# Patient Record
Sex: Male | Born: 1960
Health system: Southern US, Community
[De-identification: ages and names within clinical notes are randomized; demographics above are authoritative.]

## PROBLEM LIST (undated history)

## (undated) DIAGNOSIS — R1030 Lower abdominal pain, unspecified: Secondary | ICD-10-CM

## (undated) DIAGNOSIS — N509 Disorder of male genital organs, unspecified: Secondary | ICD-10-CM

## (undated) DIAGNOSIS — S335XXA Sprain of ligaments of lumbar spine, initial encounter: Secondary | ICD-10-CM

## (undated) DIAGNOSIS — M545 Low back pain, unspecified: Secondary | ICD-10-CM

## (undated) DIAGNOSIS — K4090 Unilateral inguinal hernia, without obstruction or gangrene, not specified as recurrent: Secondary | ICD-10-CM

## (undated) HISTORY — DX: Disorder of male genital organs, unspecified: N50.9

## (undated) HISTORY — PX: HERNIA REPAIR: SHX51

## (undated) HISTORY — DX: Lower abdominal pain, unspecified: R10.30

## (undated) HISTORY — DX: Unilateral inguinal hernia, without obstruction or gangrene, not specified as recurrent: K40.90

## (undated) HISTORY — DX: Low back pain: M54.5

## (undated) HISTORY — DX: Sprain of ligaments of lumbar spine, initial encounter: S33.5XXA

## (undated) HISTORY — DX: Low back pain, unspecified: M54.50

---

## 2004-06-13 ENCOUNTER — Ambulatory Visit: Payer: Self-pay | Admitting: Family Medicine

## 2004-07-07 ENCOUNTER — Ambulatory Visit: Payer: Self-pay | Admitting: Family Medicine

## 2004-07-20 ENCOUNTER — Ambulatory Visit: Payer: Self-pay | Admitting: Family Medicine

## 2004-07-20 ENCOUNTER — Ambulatory Visit (HOSPITAL_COMMUNITY): Admission: RE | Admit: 2004-07-20 | Discharge: 2004-07-20 | Payer: Self-pay | Admitting: Family Medicine

## 2004-09-07 ENCOUNTER — Ambulatory Visit (HOSPITAL_COMMUNITY): Admission: RE | Admit: 2004-09-07 | Discharge: 2004-09-07 | Payer: Self-pay | Admitting: Sports Medicine

## 2004-10-03 ENCOUNTER — Ambulatory Visit: Payer: Self-pay | Admitting: Family Medicine

## 2005-02-13 ENCOUNTER — Ambulatory Visit: Payer: Self-pay | Admitting: Family Medicine

## 2005-02-14 ENCOUNTER — Ambulatory Visit: Payer: Self-pay | Admitting: Sports Medicine

## 2006-04-05 DIAGNOSIS — H9190 Unspecified hearing loss, unspecified ear: Secondary | ICD-10-CM | POA: Insufficient documentation

## 2006-04-05 DIAGNOSIS — F17200 Nicotine dependence, unspecified, uncomplicated: Secondary | ICD-10-CM | POA: Insufficient documentation

## 2010-06-10 ENCOUNTER — Other Ambulatory Visit (HOSPITAL_COMMUNITY): Payer: Self-pay | Admitting: Radiology

## 2010-06-29 ENCOUNTER — Other Ambulatory Visit (HOSPITAL_COMMUNITY): Payer: Self-pay | Admitting: Family Medicine

## 2010-07-05 ENCOUNTER — Other Ambulatory Visit (HOSPITAL_COMMUNITY): Payer: Self-pay

## 2010-07-14 ENCOUNTER — Other Ambulatory Visit (HOSPITAL_COMMUNITY): Payer: Self-pay | Admitting: Family Medicine

## 2010-07-15 ENCOUNTER — Ambulatory Visit (HOSPITAL_COMMUNITY): Payer: Self-pay | Attending: Family Medicine | Admitting: Radiology

## 2010-07-15 ENCOUNTER — Other Ambulatory Visit (HOSPITAL_COMMUNITY): Payer: Self-pay | Admitting: Radiology

## 2010-07-15 DIAGNOSIS — R0989 Other specified symptoms and signs involving the circulatory and respiratory systems: Secondary | ICD-10-CM

## 2010-07-15 DIAGNOSIS — R5383 Other fatigue: Secondary | ICD-10-CM | POA: Insufficient documentation

## 2010-07-15 DIAGNOSIS — F17200 Nicotine dependence, unspecified, uncomplicated: Secondary | ICD-10-CM | POA: Insufficient documentation

## 2010-07-15 DIAGNOSIS — R5381 Other malaise: Secondary | ICD-10-CM | POA: Insufficient documentation

## 2010-07-15 DIAGNOSIS — R072 Precordial pain: Secondary | ICD-10-CM | POA: Insufficient documentation

## 2010-07-15 DIAGNOSIS — E785 Hyperlipidemia, unspecified: Secondary | ICD-10-CM | POA: Insufficient documentation

## 2012-11-17 ENCOUNTER — Encounter (HOSPITAL_COMMUNITY): Payer: Self-pay | Admitting: Emergency Medicine

## 2012-11-17 ENCOUNTER — Emergency Department (HOSPITAL_COMMUNITY)
Admission: EM | Admit: 2012-11-17 | Discharge: 2012-11-17 | Disposition: A | Discharge status: Home / Self Care | Payer: Worker's Compensation | Attending: Emergency Medicine | Admitting: Emergency Medicine

## 2012-11-17 DIAGNOSIS — R209 Unspecified disturbances of skin sensation: Secondary | ICD-10-CM | POA: Insufficient documentation

## 2012-11-17 DIAGNOSIS — M545 Low back pain, unspecified: Secondary | ICD-10-CM | POA: Insufficient documentation

## 2012-11-17 DIAGNOSIS — M549 Dorsalgia, unspecified: Secondary | ICD-10-CM

## 2012-11-17 DIAGNOSIS — Z87828 Personal history of other (healed) physical injury and trauma: Secondary | ICD-10-CM | POA: Insufficient documentation

## 2012-11-17 MED ORDER — OXYCODONE-ACETAMINOPHEN 5-325 MG PO TABS: 1.0000 | ORAL_TABLET | ORAL | Status: DC | PRN
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
## 2012-11-19 NOTE — ED Provider Notes (Signed)
Medical screening examination/treatment/procedure(s) were performed by non-physician practitioner and as supervising physician I was immediately available for consultation/collaboration.   Charles B. Bernette Mayers, MD 11/19/12 [phone]

## 2013-02-06 DIAGNOSIS — K4090 Unilateral inguinal hernia, without obstruction or gangrene, not specified as recurrent: Secondary | ICD-10-CM

## 2013-02-06 HISTORY — DX: Unilateral inguinal hernia, without obstruction or gangrene, not specified as recurrent: K40.90

## 2013-03-13 ENCOUNTER — Ambulatory Visit (INDEPENDENT_AMBULATORY_CARE_PROVIDER_SITE_OTHER): Payer: Self-pay | Admitting: General Surgery

## 2013-03-14 ENCOUNTER — Ambulatory Visit (INDEPENDENT_AMBULATORY_CARE_PROVIDER_SITE_OTHER): Payer: Self-pay | Admitting: General Surgery

## 2013-03-26 ENCOUNTER — Ambulatory Visit (INDEPENDENT_AMBULATORY_CARE_PROVIDER_SITE_OTHER): Payer: Worker's Compensation | Admitting: General Surgery

## 2013-03-27 ENCOUNTER — Ambulatory Visit (INDEPENDENT_AMBULATORY_CARE_PROVIDER_SITE_OTHER): Payer: Worker's Compensation | Admitting: General Surgery

## 2013-06-09 ENCOUNTER — Ambulatory Visit (INDEPENDENT_AMBULATORY_CARE_PROVIDER_SITE_OTHER): Payer: Worker's Compensation | Admitting: General Surgery

## 2013-06-09 ENCOUNTER — Encounter (INDEPENDENT_AMBULATORY_CARE_PROVIDER_SITE_OTHER): Payer: Self-pay | Admitting: General Surgery

## 2013-06-09 VITALS — BP 128/82 | HR 66 | Temp 98.0°F | Resp 16 | Ht 65.0 in | Wt 175.4 lb

## 2013-06-09 DIAGNOSIS — K4020 Bilateral inguinal hernia, without obstruction or gangrene, not specified as recurrent: Secondary | ICD-10-CM

## 2013-06-09 DIAGNOSIS — K429 Umbilical hernia without obstruction or gangrene: Secondary | ICD-10-CM

## 2013-06-09 NOTE — Progress Notes (Signed)
Patient ID: Marcus Solis, male   DOB: [birth year], 53 y.o.   MRN: [id number]  No chief complaint on file.   HPI Marcus Solis Matter is a 53 y.o. male.  The patient is a 53 year old male, Spanish speaking, who arrives today for evaluation of a left inguinal hernia. Patient states he was lifting heavy marble/Granite tabletop. The patient said that time he noticed pain his abdominal/inguinal as well as back area.  The patient has undergone a workup for back issues currently there is no operative plan. The patient states that he notices burning to his inguinal and testicular area. He just noticed a bulge in the left angle area. He also feels he has some pain in his umbilicus with Valsalva. HPI  Past Medical History  Diagnosis Date  . Unspecified disorder of male genital organs   . Sprain of lumbar region   . Lower abdominal pain   . Lumbago   . Groin pain   . Left inguinal hernia 2015    History reviewed. No pertinent past surgical history.  No family history on file.  Social History History  Substance Use Topics  . Smoking status: Light Tobacco Smoker  . Smokeless tobacco: Not on file  . Alcohol Use: No    No Known Allergies  Current Outpatient Prescriptions  Medication Sig Dispense Refill  . Diclofenac Sodium (VOLTAREN PO) Take by mouth.      . methocarbamol (ROBAXIN) 500 MG tablet Take 500 mg by mouth 4 (four) times daily.      [name] oxyCODONE-acetaminophen (PERCOCET/ROXICET) 5-325 MG per tablet Take 1 tablet by mouth every 4 (four) hours as needed for pain.  10 tablet  0  . traMADol (ULTRAM) 50 MG tablet Take by mouth every 6 (six) hours as needed.       No current facility-administered medications for this visit.    Review of Systems Review of Systems  Constitutional: Negative.   HENT: Negative.   Eyes: Negative.   Respiratory: Negative.   Cardiovascular: Negative.   Gastrointestinal: Negative.   Endocrine: Negative.   Neurological: Negative.     Blood  pressure 128/82, pulse 66, temperature 98 F (36.7 C), temperature source Temporal, resp. rate 16, height 5\' 5"  (1.651 m), weight 175 lb 6.4 oz (79.561 kg).  Physical Exam Physical Exam  Constitutional: He is oriented to person, place, and time. He appears well-developed and well-nourished.  HENT:  Head: Normocephalic and atraumatic.  Eyes: Conjunctivae and EOM are normal. Pupils are equal, round, and reactive to light.  Neck: Normal range of motion. Neck supple.  Cardiovascular: Normal rate, regular rhythm and normal heart sounds.   Pulmonary/Chest: Effort normal and breath sounds normal.  Abdominal: Soft. Bowel sounds are normal. He exhibits no distension. There is no tenderness. There is no rebound and no guarding. A hernia is present. Hernia confirmed positive in the ventral area and confirmed positive in the left inguinal area. Hernia confirmed negative in the right inguinal area.    Musculoskeletal: Normal range of motion.  Neurological: He is alert and oriented to person, place, and time.  Skin: Skin is warm and dry.    Data Reviewed Outside CT scan reveals a left fat-containing inguinal hernia and possibly a small right fat-containing inguinal hernia  Assessment    53 year old male with a bilateral inguinal hernia as well as an umbilical hernia, likely related to his lifting injury.    Plan    1. We'll proceed to the operating room for alaparoscopic bilateral inguinal hernia repair with  Mesh. We will also proceed with a primary umbilical hernia repair via the same incision.  2. All risks and benefits were discussed with the patient, to generally include infection, bleeding, damage to surrounding structures, acute and chronic nerve pain, and recurrence. Alternatives were offered and described.  All questions were answered and the patient voiced understanding of the procedure and wishes to proceed at this point.        Ralene Ok 06/09/2013, 11:01 AM

## 2013-06-23 DIAGNOSIS — K4020 Bilateral inguinal hernia, without obstruction or gangrene, not specified as recurrent: Secondary | ICD-10-CM

## 2013-06-23 DIAGNOSIS — K429 Umbilical hernia without obstruction or gangrene: Secondary | ICD-10-CM

## 2013-06-24 ENCOUNTER — Other Ambulatory Visit (INDEPENDENT_AMBULATORY_CARE_PROVIDER_SITE_OTHER): Payer: Self-pay

## 2013-06-24 MED ORDER — OXYCODONE-ACETAMINOPHEN 5-325 MG PO TABS: 1.0000 | ORAL_TABLET | Freq: Four times a day (QID) | ORAL | Status: DC | PRN

## 2013-07-07 ENCOUNTER — Ambulatory Visit (INDEPENDENT_AMBULATORY_CARE_PROVIDER_SITE_OTHER): Payer: Worker's Compensation | Admitting: General Surgery

## 2013-07-07 ENCOUNTER — Encounter (INDEPENDENT_AMBULATORY_CARE_PROVIDER_SITE_OTHER): Payer: Self-pay | Admitting: General Surgery

## 2013-07-07 VITALS — BP 130/84 | HR 66 | Temp 97.6°F | Resp 16 | Wt 175.8 lb

## 2013-07-07 DIAGNOSIS — Z9889 Other specified postprocedural states: Principal | ICD-10-CM

## 2013-07-07 DIAGNOSIS — Z8719 Personal history of other diseases of the digestive system: Secondary | ICD-10-CM

## 2013-07-07 NOTE — Progress Notes (Signed)
Patient ID: Marcus Solis, male   DOB: [birth year], 53 y.o.   MRN: [id number] Post op course The patient is a 53 year old male status post laparoscopic bilateral inguinal hernia repair with mesh as well as a primary umbilical hernia repair. Patient is been doing well postoperatively. He has not any heavy lifting.  On Exam: Wounds are clean dry and intact, there is no hernia on palpation at either through this site   Assessment and Plan 53 year old male status post laparoscopic bilateral inguinal hernia repair with mesh and primary umbilical hernia repair 1. We discussed weightlifter stretches for another month. 2. Patient follow up as needed   Ralene Ok, MD [facility] General & Minimally Invasive Surgery Trauma & Emergency Surgery

## 2013-07-15 ENCOUNTER — Encounter (INDEPENDENT_AMBULATORY_CARE_PROVIDER_SITE_OTHER): Payer: Self-pay

## 2013-08-14 ENCOUNTER — Encounter (INDEPENDENT_AMBULATORY_CARE_PROVIDER_SITE_OTHER): Payer: Self-pay | Admitting: General Surgery

## 2013-08-14 ENCOUNTER — Ambulatory Visit (INDEPENDENT_AMBULATORY_CARE_PROVIDER_SITE_OTHER): Payer: Worker's Compensation | Admitting: General Surgery

## 2013-08-14 VITALS — BP 122/71 | HR 70 | Temp 98.0°F | Resp 18 | Ht 65.0 in | Wt 176.0 lb

## 2013-08-14 DIAGNOSIS — Z8719 Personal history of other diseases of the digestive system: Secondary | ICD-10-CM

## 2013-08-14 DIAGNOSIS — Z9889 Other specified postprocedural states: Secondary | ICD-10-CM

## 2013-08-14 NOTE — Progress Notes (Signed)
Patient ID: Marcus Solis, male   DOB: [birth year], 53 y.o.   MRN: [id number] Post op course The patient is a 53 year old male status post laparoscopic bilateral inguinal hernia repair with mesh. The patient is approximately 2 months at this time. The patient comes in today complaining of posterior leg pain that radiates down his feet. Patient also has some pain in the left testicular area as well as the midline suprapubic area.  The patient has seen a with the physician in regards to his back pain. He is undergoing treatment for that at this time.  On Exam: Wounds are clean dry and intact, and no hernial palpation   Assessment and Plan 53 year old male status post arthroscopic bilateral inguinal hernia repair with mesh 1. At this time I believe that he has is suprapubic area is likely related to the surgical field of dissection. This is normal at this time and will get better with time. The sensation he has to his left testicle is likely related to mesh placement he should get better when scar tissue deteriorates. 2. At this time, so that the patient's postoperative course has been uncomplicated and not any concern for recurrent hernia chronic pain. 2. Patient follow up as needed   Ralene Ok, MD [facility] General & Minimally Invasive Surgery Trauma & Emergency Surgery

## 2013-12-09 ENCOUNTER — Ambulatory Visit (INDEPENDENT_AMBULATORY_CARE_PROVIDER_SITE_OTHER): Payer: BC Managed Care – PPO | Admitting: Physician Assistant

## 2013-12-09 VITALS — BP 120/90 | HR 60 | Temp 98.3°F | Resp 18 | Ht 65.0 in | Wt 173.2 lb

## 2013-12-09 DIAGNOSIS — Z130 Encounter for screening for diseases of the blood and blood-forming organs and certain disorders involving the immune mechanism: Secondary | ICD-10-CM

## 2013-12-09 DIAGNOSIS — Z0000 Encounter for general adult medical examination without abnormal findings: Secondary | ICD-10-CM

## 2013-12-09 DIAGNOSIS — Z13228 Encounter for screening for other metabolic disorders: Secondary | ICD-10-CM

## 2013-12-09 DIAGNOSIS — M546 Pain in thoracic spine: Secondary | ICD-10-CM

## 2013-12-09 DIAGNOSIS — Z131 Encounter for screening for diabetes mellitus: Secondary | ICD-10-CM

## 2013-12-09 DIAGNOSIS — Z1211 Encounter for screening for malignant neoplasm of colon: Secondary | ICD-10-CM

## 2013-12-09 DIAGNOSIS — Z1212 Encounter for screening for malignant neoplasm of rectum: Secondary | ICD-10-CM

## 2013-12-09 DIAGNOSIS — Z13220 Encounter for screening for lipoid disorders: Secondary | ICD-10-CM

## 2013-12-09 LAB — COMPLETE METABOLIC PANEL WITH GFR
ALK PHOS: 98 U/L (ref 39–117)
ALT: 35 U/L (ref 0–53)
AST: 22 U/L (ref 0–37)
Albumin: 4.4 g/dL (ref 3.5–5.2)
BILIRUBIN TOTAL: 0.5 mg/dL (ref 0.2–1.2)
BUN: 10 mg/dL (ref 6–23)
CO2: 27 mEq/L (ref 19–32)
CREATININE: 0.74 mg/dL (ref 0.50–1.35)
Calcium: 9.2 mg/dL (ref 8.4–10.5)
Chloride: 101 mEq/L (ref 96–112)
GFR, Est African American: >89 mL/min
GFR, Est Non African American: >89 mL/min
Glucose, Bld: 88 mg/dL (ref 70–99)
Potassium: 4.4 mEq/L (ref 3.5–5.3)
Sodium: 137 mEq/L (ref 135–145)
Total Protein: 7.3 g/dL (ref 6.0–8.3)

## 2013-12-09 LAB — POCT CBC
GRANULOCYTE PERCENT: 46.4 % (ref 37–80)
HEMATOCRIT: 50.9 % (ref 43.5–53.7)
HEMOGLOBIN: 16.9 g/dL (ref 14.1–18.1)
LYMPH, POC: 2.6 (ref 0.6–3.4)
MCH, POC: 30.2 pg (ref 27–31.2)
MCHC: 33.1 g/dL (ref 31.8–35.4)
MCV: 91.2 fL (ref 80–97)
MID (cbc): 0.4 (ref 0–0.9)
MPV: 6.9 fL (ref 0–99.8)
POC GRANULOCYTE: 2.6 (ref 2–6.9)
POC LYMPH PERCENT: 45.7 %L (ref 10–50)
POC MID %: 7.9 %M (ref 0–12)
Platelet Count, POC: 361 10*3/uL (ref 142–424)
RBC: 5.58 M/uL (ref 4.69–6.13)
RDW, POC: 12.7 %
WBC: 5.6 10*3/uL (ref 4.6–10.2)

## 2013-12-09 LAB — LIPID PANEL
CHOL/HDL RATIO: 7.3 ratio
CHOLESTEROL: 249 mg/dL — AB (ref 0–200)
HDL: 34 mg/dL — AB (ref >39)
LDL Cholesterol: 154 mg/dL — ABNORMAL HIGH (ref 0–99)
Triglycerides: 307 mg/dL — ABNORMAL HIGH (ref <150)
VLDL: 61 mg/dL — ABNORMAL HIGH (ref 0–40)

## 2013-12-09 LAB — POCT URINALYSIS DIPSTICK
BILIRUBIN UA: NEGATIVE
Blood, UA: NEGATIVE
GLUCOSE UA: NEGATIVE
KETONES UA: NEGATIVE
LEUKOCYTES UA: NEGATIVE
Nitrite, UA: NEGATIVE
Protein, UA: NEGATIVE
Spec Grav, UA: >=1.03
Urobilinogen, UA: 0.2
pH, UA: 5

## 2013-12-09 NOTE — Progress Notes (Signed)
Subjective:    Patient ID: Marcus Solis, male    DOB: [birth year], 53 y.o.   MRN: [id number] Past Medical History  Diagnosis Date  . Unspecified disorder of male genital organs   . Sprain of lumbar region   . Lower abdominal pain   . Lumbago   . Groin pain   . Left inguinal hernia 20151    HPI  53 year old male is here today for a complete physical exam.  He has a concern of thoracic back pain.  This began 1 year ago after lifting something at work.  He says since, his back is in pain.  He also had associated lumbar back pain which he is being seen at [facility], which apparently, happened at the same time.  The thoracic back pain is constant and unaffected by sitting, laying, or moving.  He has numbness and tingling in his arms, and a sensation of pins and needles at his fingertips.    Lumbar pain has numbness at left leg and weakness, but is worked up again by [employer], worker's comp?  He has been out of work for 1 year.  He is unsure when he is going to be eligible to work again, but possibly December or January.  He states that he did PT for injuries, but they discontinued the tx due to the pain.  He does not do them at home due to the pain.  He takes ultram for pain, because it is the only thing that works.  He states that the opiates nor the muscle relaxants treated his pain.  He has done minimal activity over the last year.  Per physical exam's review of systems  Review of Systems  Constitutional: Negative for fever, chills and malaise/fatigue.  Eyes: Negative for photophobia and pain.  Respiratory: Negative for cough, shortness of breath and wheezing.   Cardiovascular: Negative for chest pain, orthopnea and leg swelling.  Gastrointestinal: Negative for nausea, vomiting, diarrhea and blood in stool.  Genitourinary: Negative for dysuria, urgency, frequency and flank pain.  Musculoskeletal: Positive for back pain and neck pain. Negative for  myalgias.  Skin: Negative for rash.  Neurological: Positive for tingling and weakness. Negative for dizziness, tremors and headaches.  Psychiatric/Behavioral: The patient does not have insomnia.    He had a prostate exam 4 months ago, which he reports as normal.   He was prescribed to wear reading glasses less than a year ago.    Checked prostate 4 months ago.    Social History: Not currently working, due to injury at job.   Married, 4 children with 2 at home. Tobacco Use: 3-4 cigarettes/day  Review of Systems  Eyes: Negative for visual disturbance.  Cardiovascular: Negative for chest pain and leg swelling.  Gastrointestinal: Negative for nausea, vomiting and blood in stool.  Genitourinary: Negative for dysuria and frequency.       Objective:  Physical Exam  Constitutional: He is oriented to person, place, and time. He appears well-developed and well-nourished. No distress.  HENT:  Head: Normocephalic and atraumatic.  Eyes: Conjunctivae are normal. Pupils are equal, round, and reactive to light. Right eye exhibits no discharge. Left eye exhibits no discharge.  Neck: Normal range of motion.  Cardiovascular: Normal rate, regular rhythm and normal heart sounds.  Exam reveals no gallop and no friction rub.   No murmur heard. Pulmonary/Chest: Effort normal and breath sounds normal. No respiratory distress. He has no wheezes.  Abdominal: Soft. Bowel sounds are normal. He  exhibits no distension and no mass. There is no tenderness.  Musculoskeletal: He exhibits no edema.       Cervical back: He exhibits tenderness (cervical spine tenderness with palpation.) and spasm (Bilateral sides of spine.).       Thoracic back: He exhibits tenderness and spasm.  Resisted flexion and extension of lower extremities were weak, left side>right.    Normal ROM and strength of upper extremities, with no radiating pain with movement.    Lymphadenopathy:    He has no cervical adenopathy.  Neurological:  He is alert and oriented to person, place, and time. He displays normal reflexes. No cranial nerve deficit. Coordination normal.  Skin: Skin is warm and dry. No rash noted. He is not diaphoretic. No erythema.  Psychiatric: He has a normal mood and affect. His behavior is normal. Judgment and thought content normal.          Assessment & Plan:   Physical exam, annual Screening for deficiency anemia - Plan: POCT CBC Screening for metabolic disorder - Plan: COMPLETE METABOLIC PANEL WITH GFR Screening for diabetes mellitus - Plan: POCT urinalysis dipstick Screening for hyperlipidemia - Plan: Lipid panel Screening for colorectal cancer - Plan: IFOBT POC (occult bld, rslt in office), will mail specimen from home.    Thoracic back pain, unspecified back pain laterality  -Ambulatory referral to Orthopedic Surgery -Spinal tenderness at cervical and thoracic portion of spine.  Refuses to have xray of neck, though he emphasizes that he wants help for his neck.  He also specifies that he wants MRI of thoracic back, but once the orders were made, stated that he did not want it.  Declines muscle relaxants.  We will do a referral to ortho and let them sort this out due to him being on medications that should have helped him but have not.  Ivar Drape, PA-C [facility] 11/3/20154:58 PM

## 2013-12-09 NOTE — Patient Instructions (Signed)
You will get a call for the orthopaedic referral.  I will contact you about the results of your lab.

## 2013-12-11 ENCOUNTER — Telehealth: Payer: Self-pay

## 2013-12-11 LAB — IFOBT (OCCULT BLOOD): IMMUNOLOGICAL FECAL OCCULT BLOOD TEST: NEGATIVE

## 2013-12-11 NOTE — Telephone Encounter (Signed)
Patient has referral for Ortho. They will contact him for an appointment. Patient says he speaks no english and wants to get an MRI this week. Please advise. I gave him options either the ortho dr. Lilian Coma or we send him to an imaging center. Please advise. Thank you. He says his back is in a lot of pain.     [phone]

## 2013-12-11 NOTE — Telephone Encounter (Signed)
He has a referral for ortho.  He would not let me do xrays and I will not do a MRI - that is for ortho to order.

## 2013-12-11 NOTE — Telephone Encounter (Signed)
OV states to refer to Ortho- please advise if MRI would be acceptable first or allow ortho specialist to address pt condition and order if necessary.

## 2013-12-12 NOTE — Progress Notes (Signed)
I was directly involved with the patient's care and agree with the physical, diagnosis and treatment plan.  

## 2013-12-15 ENCOUNTER — Encounter: Payer: Self-pay | Admitting: Internal Medicine

## 2013-12-15 NOTE — Telephone Encounter (Signed)
Asked Lilly to translate to pt.

## 2013-12-16 NOTE — Telephone Encounter (Signed)
Patient voiced understanding.

## 2013-12-23 ENCOUNTER — Telehealth: Payer: Self-pay | Admitting: *Deleted

## 2013-12-23 MED ORDER — ATORVASTATIN CALCIUM 10 MG PO TABS: 10.0000 mg | ORAL_TABLET | Freq: Every day | ORAL | Status: DC

## 2013-12-23 NOTE — Telephone Encounter (Signed)
I reviewed his labs and spoke to the patient about his results.  I also gave him his appt with [facility] for 11/19.  We will start him on Lipitor due to his cholesterol and he will recheck in 6 weeks to see if he is having improvement and to make sure he is not having problems with the medication.

## 2013-12-23 NOTE — Telephone Encounter (Signed)
Pt in office looking for lab results- not reviewed as of today. Language Barrier- Sent message to Windell Hummingbird to get results.

## 2013-12-23 NOTE — Telephone Encounter (Signed)
FYI

## 2014-01-22 ENCOUNTER — Ambulatory Visit (INDEPENDENT_AMBULATORY_CARE_PROVIDER_SITE_OTHER): Payer: BC Managed Care – PPO | Admitting: Family Medicine

## 2014-01-22 ENCOUNTER — Ambulatory Visit (INDEPENDENT_AMBULATORY_CARE_PROVIDER_SITE_OTHER): Payer: BC Managed Care – PPO

## 2014-01-22 VITALS — BP 136/90 | HR 76 | Temp 97.7°F | Resp 20 | Ht 66.0 in | Wt 178.6 lb

## 2014-01-22 DIAGNOSIS — M545 Low back pain: Secondary | ICD-10-CM

## 2014-01-22 DIAGNOSIS — M546 Pain in thoracic spine: Secondary | ICD-10-CM

## 2014-01-22 DIAGNOSIS — M62838 Other muscle spasm: Secondary | ICD-10-CM

## 2014-01-22 DIAGNOSIS — M549 Dorsalgia, unspecified: Secondary | ICD-10-CM

## 2014-01-22 MED ORDER — MELOXICAM 7.5 MG PO TABS: 7.5000 mg | ORAL_TABLET | Freq: Every day | ORAL | Status: DC

## 2014-01-22 MED ORDER — CYCLOBENZAPRINE HCL 5 MG PO TABS: ORAL_TABLET | ORAL | Status: DC

## 2014-01-22 NOTE — Patient Instructions (Addendum)
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 Si simptomas empeoran - regrese aqui o departmento de emergencia.  meloxicam cada dia para su dolor, cyclobenzaprine por la noche, o si necesario - cada 8 horas.

## 2014-01-22 NOTE — Progress Notes (Addendum)
Subjective:  This chart was scribed for Marcus Ray, MD by Marcus Solis, ED Scribe at [facility].The patient was seen in exam room 05 and the patient's care was started at 12:28 PM.   Patient ID: Marcus Solis, male    DOB: 02-Dec-1960, 53 y.o.   MRN: [id number] Chief Complaint  Patient presents with  . Flu Vaccine  . Back Pain    x8 months; pain is from lower back and radiates to shoulder blades  Spanish speaking only. Spanish speaking provider was used in the office to translate with understanding expressed. HPI HPI Comments: Marcus Solis is a 52 y.o. male who presents to [facility] complaining of ongoing lower back pain as well as new upper back and neck pain. He was last seen for a physical by Marcus Solis here. Noted at that time having thoracic and lumbar back pain due to lifting at work. His injury occurred October 18 2012 at work. He has pain in his lower back. He was treated at [facility]. Reported left leg numbness and weakness and November visit. Had been out of work for 1 year. Reported having done PT but had to discontinue due to the pain. Had taken ultram for pain. She had recommended x-Solis of cervical spine but per notes that was declined. Also attempted to order MRI of thoracic spine but this was also declined. He was referred to orthopedics and per notes had an appointment with Marcus Solis on November 19th, but was not seen by Marcus Solis or his visit was not completed? As not covered by [employer]? Pt does not want to be seen by Marcus Solis but wants to see Marcus Solis & Marcus Solis.   Reported having hip injury and several hernia surgeries due to initial injury at work. Since having hernia surgery he notes having intermittent pain that area.  Pt is here to discuss his upper back and neck pain since this was declined by workers comp. His upper back pain also from injury but did not report it since at the time is was minor. He notes   the pain has gradually worsened. He has sharp and hot pain in his mid and upper back. He did report his upper back pain to his PT doctor and was told it was most likely perispinal injury.  Patient Active Problem List   Diagnosis Date Noted  . TOBACCO DEPENDENCE 04/05/2006  . HEARING LOSS NOS OR DEAFNESS 04/05/2006   Past Medical History  Diagnosis Date  . Unspecified disorder of male genital organs   . Sprain of lumbar region   . Lower abdominal pain   . Lumbago   . Groin pain   . Left inguinal hernia 2015   Past Surgical History  Procedure Laterality Date  . Hernia repair     No Known Allergies Prior to Admission medications   Medication Sig Start Date End Date Taking? Authorizing Provider  atorvastatin (LIPITOR) 10 MG tablet Take 1 tablet (10 mg total) by mouth daily. 12/23/13  Yes Mancel Bale, PA-C  traMADol (ULTRAM) 50 MG tablet Take by mouth every 6 (six) hours as needed.    Historical Provider, MD   History   Social History  . Marital Status: Married    Spouse Name: N/A    Number of Children: N/A  . Years of Education: N/A   Occupational History  . Not on file.   Social History Main Topics  . Smoking status: Light Tobacco Smoker  .  Smokeless tobacco: Not on file  . Alcohol Use: No  . Drug Use: No  . Sexual Activity: Not on file   Other Topics Concern  . Not on file   Social History Narrative   Review of Systems  Musculoskeletal: Positive for back pain and neck pain.       Objective:  BP 136/90 mmHg  Pulse 76  Temp(Src) 97.7 F (36.5 C) (Oral)  Resp 20  Ht 5\' 6"  (1.676 m)  Wt 178 lb 9.6 oz (81.012 kg)  BMI 28.84 kg/m2  SpO2 97%  Physical Exam  Constitutional: He is oriented to person, place, and time. He appears well-developed and well-nourished.  HENT:  Head: Normocephalic and atraumatic.  Eyes: EOM are normal.  Neck: Normal range of motion.  Cardiovascular: Normal rate.   Pulmonary/Chest: Effort normal.  Musculoskeletal: Normal range of  motion.  Lumbar Spine-Flexion 80 degrees, full extension, equal lateral flexion, and equal rotation.  Diffusely tender over mid and lover back as well as tender with slight spasms over perispinal lumbar and thoracic muscles. Slight rhomboid TTP and spasms. Cervical spine- Cervical flexion intact, extension lacking approximately  30 degrees. Left and right rotation lacking approximately 20 degrees. Lack approximately 10 degrees left lateral flexion. Does have reproduction of upper back symptoms with neck flexion.  Neurological: He is alert and oriented to person, place, and time.  Reflex Scores:      Bicep reflexes are 2+ on the right side and 1+ on the left side. Other reflexes are difficult to obtain. Does have equal grip strength.   Skin: Skin is warm and dry.  Psychiatric: He has a normal mood and affect. His behavior is normal.  Nursing note and vitals reviewed. NVI distally.    [facility] reading (PRIMARY) by  Dr. Carlota Solis:  C spine: decreased lordosis, otherwise no acute bony findings.  T spine: no acute bony findings.       Assessment & Plan:  Marcus Solis is a 53 y.o. male Upper back pain - Plan: DG Cervical Spine Complete, DG Thoracic Spine 2 View  Low back pain, unspecified back pain laterality, with sciatica presence unspecified - Plan: DG Cervical Spine Complete, DG Thoracic Spine 2 View  Muscle spasm - Plan: DG Cervical Spine Complete, DG Thoracic Spine 2 View  Initial low back injury at work with reported milder but upper back symptoms at that time as well, but this was not initially reported. Has been treated by ortho for his LBP including PT, but has had persistent upper back pain that by his report has been diagnosed as muscle spasm/perispinal pain when treated by ortho in the past. Suspect he may need physical therapy for upper back, but will refer to ortho to decide if additional imaging initially needed (did have some difficulty with reflex assessment in UE's) and  for evaluation as pain present for over a year now. In the meantime, can start mobic, flexeril if needed(SDE) and rom of back and cervical spine. ER/RTC precautions given.   In regards to intermittent pain at hernia sites, without recent changes - he was advised to call surgeon that treated him prior to discuss these symptoms. Rtc/er precautions.   Spanish spoken and other spanish speaking provider assisted with language barrier. Understanding expressed.     No orders of the defined types were placed in this encounter.   Patient Instructions  Voy a referir a un orthopedico. Si simptomas empeoran - regrese aqui o departmento de emergencia.  meloxicam cada dia para su  dolor, cyclobenzaprine por la noche, o si necesario - cada 8 horas.

## 2015-03-18 DIAGNOSIS — G894 Chronic pain syndrome: Secondary | ICD-10-CM

## 2015-10-06 ENCOUNTER — Ambulatory Visit: Payer: Self-pay | Attending: Internal Medicine

## 2016-06-23 ENCOUNTER — Ambulatory Visit: Payer: Self-pay | Admitting: Internal Medicine

## 2016-06-27 ENCOUNTER — Encounter (HOSPITAL_COMMUNITY): Payer: Self-pay | Admitting: Emergency Medicine

## 2016-06-27 ENCOUNTER — Ambulatory Visit: Payer: Self-pay | Admitting: Urgent Care

## 2016-06-27 ENCOUNTER — Ambulatory Visit (HOSPITAL_COMMUNITY)
Admission: EM | Admit: 2016-06-27 | Discharge: 2016-06-27 | Disposition: A | Discharge status: Home / Self Care | Payer: Self-pay | Attending: Family Medicine | Admitting: Family Medicine

## 2016-06-27 DIAGNOSIS — L239 Allergic contact dermatitis, unspecified cause: Secondary | ICD-10-CM

## 2016-06-27 DIAGNOSIS — R21 Rash and other nonspecific skin eruption: Secondary | ICD-10-CM

## 2016-06-27 MED ORDER — DEXAMETHASONE SODIUM PHOSPHATE 10 MG/ML IJ SOLN
10.0000 mg | Freq: Once | INTRAMUSCULAR | Status: AC
  Administered 2016-06-27: 10 mg via INTRAMUSCULAR

## 2016-06-27 MED ORDER — DEXAMETHASONE SODIUM PHOSPHATE 10 MG/ML IJ SOLN
INTRAMUSCULAR | Status: AC
  Filled 2016-06-27: qty 1

## 2016-06-27 MED ORDER — HYDROXYZINE HCL 25 MG PO TABS: ORAL_TABLET | ORAL | 0 refills | Status: DC

## 2016-06-27 MED ORDER — PREDNISONE 10 MG (21) PO TBPK: ORAL_TABLET | ORAL | 0 refills | Status: DC

## 2016-06-27 NOTE — ED Provider Notes (Signed)
CSN: [id number]     Arrival date & time 06/27/16  1041 History   None    Chief Complaint  Patient presents with  . Rash   (Consider location/radiation/quality/duration/timing/severity/associated sxs/prior Treatment) Patient c/o rash from poison ivy x 4 days.  He state he has rash on chest and face.   The history is provided by the patient.  Rash  Location:  Head/neck Quality: itchiness and redness   Severity:  Moderate Onset quality:  Sudden Duration:  2 days Timing:  Constant Chronicity:  New Relieved by:  Nothing Worsened by:  Nothing Ineffective treatments:  None tried   Past Medical History:  Diagnosis Date  . Groin pain   . Left inguinal hernia 2015  . Lower abdominal pain   . Lumbago   . Sprain of lumbar region   . Unspecified disorder of male genital organs    Past Surgical History:  Procedure Laterality Date  . HERNIA REPAIR     Family History  Problem Relation Age of Onset  . Hyperlipidemia Mother   . Cancer Father   . Hypertension Sister    Social History  Substance Use Topics  . Smoking status: Light Tobacco Smoker  . Smokeless tobacco: Not on file  . Alcohol use No    Review of Systems  Constitutional: Negative.   HENT: Negative.   Eyes: Negative.   Respiratory: Negative.   Cardiovascular: Negative.   Gastrointestinal: Negative.   Endocrine: Negative.   Genitourinary: Negative.   Musculoskeletal: Negative.   Skin: Positive for rash.  Allergic/Immunologic: Negative.   Neurological: Negative.   Hematological: Negative.   Psychiatric/Behavioral: Negative.     Allergies  Patient has no known allergies.  Home Medications   Prior to Admission medications   Medication Sig Start Date End Date Taking? Authorizing Provider  hydrOXYzine (ATARAX/VISTARIL) 25 MG tablet One to Two po q 6 hours prn rash 06/27/16   Lysbeth Penner, FNP  predniSONE (STERAPRED UNI-PAK 21 TAB) 10 MG (21) TBPK tablet Take 6-5-4-3-2-1 po qd 06/27/16   Lysbeth Penner, FNP   Meds Ordered and Administered this Visit   Medications  dexamethasone (DECADRON) injection 10 mg (10 mg Intramuscular Given 06/27/16 1125)    BP (!) 162/79 (BP Location: Right Arm)   Pulse 75   Temp 98.2 F (36.8 C) (Oral)   Resp 18   SpO2 98%  No data found.   Physical Exam  Constitutional: He appears well-developed and well-nourished.  HENT:  Head: Normocephalic and atraumatic.  Eyes: Conjunctivae and EOM are normal. Pupils are equal, round, and reactive to light.  Neck: Normal range of motion.  Cardiovascular: Normal rate, regular rhythm and normal heart sounds.   Pulmonary/Chest: Effort normal and breath sounds normal.  Skin: Rash noted.  Erythematous and itchy rash on face and chest and arms.  Nursing note and vitals reviewed.   Urgent Care Course     Procedures (including critical care time)  Labs Review Labs Reviewed - No data to display  Imaging Review No results found.   Visual Acuity Review  Right Eye Distance:   Left Eye Distance:   Bilateral Distance:    Right Eye Near:   Left Eye Near:    Bilateral Near:         MDM   1. Allergic contact dermatitis, unspecified trigger    Prednisone 10mg  6-5-4-3-2-1 po qd #21 Hydroxyzine 25mg  one po q 6 hours prn #24      Lysbeth Penner, FNP 06/27/16  [place], [place] 06/27/16 [phone]

## 2016-06-27 NOTE — ED Triage Notes (Signed)
The patient presented to the [facility] with a complaint of a rash that he believed to be poison ivy x 4 days.

## 2016-08-28 ENCOUNTER — Ambulatory Visit (HOSPITAL_COMMUNITY)
Admission: EM | Admit: 2016-08-28 | Discharge: 2016-08-28 | Disposition: A | Discharge status: Home / Self Care | Payer: Self-pay | Attending: Internal Medicine | Admitting: Internal Medicine

## 2016-08-28 ENCOUNTER — Encounter (HOSPITAL_COMMUNITY): Payer: Self-pay | Admitting: Emergency Medicine

## 2016-08-28 DIAGNOSIS — H1013 Acute atopic conjunctivitis, bilateral: Secondary | ICD-10-CM

## 2016-08-28 NOTE — Discharge Instructions (Signed)
Obtain eyedrops called Zaditor and put 1 drop in each eye twice a day. Also obtain eyedrops called Systane and use as often as needed to lubricate the eye. You may also place a small amount of 1% hydrocortisone cream on the baggy part of the eyes sagging below the lower eyelid. Do not get into the eye. If not getting better this week call the ophthalmologist for appointment.

## 2016-08-28 NOTE — ED Triage Notes (Signed)
Pt here for bilateral eye irritation onset 2 months +++ associated w/puffiness around eyes and blurred vision  Denies inj/trauma  Sts he was seen here on 06/27/16 for poison ivy and since then, he has had this problem  Denies fevers, chills  A&O x4... NAD... Ambulatory

## 2016-08-28 NOTE — ED Provider Notes (Signed)
CSN: [id number]     Arrival date & time 08/28/16  74 History   First MD Initiated Contact with Patient 08/28/16 1635     Chief Complaint  Patient presents with  . Eye Problem   (Consider location/radiation/quality/duration/timing/severity/associated sxs/prior Treatment) Shonna Chock used as interpreter This 56 year old man stating that he said 3 months of problems with the right greater than left. He is concerned about some clear watery discharge from the right eye, swelling just below the lids of the eyes right greater than left. He occasionally feels like there is sandpaper or grit in the eyes. He states when he gets mad he has spasms. His vision is unchanged except when he has watering of the right eye is blurry. No history of trauma. He has G is no medications. He was seen here a couple months ago for poison ivy over the face which resulted in much swelling and a rash. He was treated for that and it abated.      Past Medical History:  Diagnosis Date  . Groin pain   . Left inguinal hernia 2015  . Lower abdominal pain   . Lumbago   . Sprain of lumbar region   . Unspecified disorder of male genital organs    Past Surgical History:  Procedure Laterality Date  . HERNIA REPAIR     Family History  Problem Relation Age of Onset  . Hyperlipidemia Mother   . Cancer Father   . Hypertension Sister    Social History  Substance Use Topics  . Smoking status: Light Tobacco Smoker  . Smokeless tobacco: Never Used  . Alcohol use No    Review of Systems  Constitutional: Negative.   HENT: Negative.   Eyes: Positive for discharge, redness and itching. Negative for photophobia.  Respiratory: Negative.   All other systems reviewed and are negative.   Allergies  Patient has no known allergies.  Home Medications   Prior to Admission medications   Medication Sig Start Date End Date Taking? Authorizing Provider  hydrOXYzine (ATARAX/VISTARIL) 25 MG tablet One to Two po q 6 hours prn rash  06/27/16   Lysbeth Penner, FNP  predniSONE (STERAPRED UNI-PAK 21 TAB) 10 MG (21) TBPK tablet Take 6-5-4-3-2-1 po qd 06/27/16   Lysbeth Penner, FNP   Meds Ordered and Administered this Visit  Medications - No data to display  BP 134/75 (BP Location: Left Arm)   Pulse (!) 56   Temp 98.3 F (36.8 C) (Oral)   Resp 20   SpO2 96%  No data found.   Physical Exam  Constitutional: He appears well-developed and well-nourished. No distress.  HENT:  Head: Normocephalic and atraumatic.  Eyes: Pupils are equal, round, and reactive to light. EOM are normal.  Anterior chamber clear, bilateral sclera with minor injection. Lower conjunctiva with minor erythema and swelling on the right lesser to the left. Under magnification no defects are seen. No foreign body seen.  Neck: Normal range of motion. Neck supple.  Pulmonary/Chest: Effort normal.  Neurological: He is alert.  Skin: Skin is warm and dry.  Psychiatric: He has a normal mood and affect.  Nursing note and vitals reviewed.   Urgent Care Course     Procedures (including critical care time)  Labs Review Labs Reviewed - No data to display  Imaging Review No results found.   Visual Acuity Review  Right Eye Distance: (S) 20/30 w/o glasses Left Eye Distance: (S) 20/25 w/o glasses.  Bilateral Distance: (S) 20/30 w/o glasses  Right Eye Near:   Left Eye Near:    Bilateral Near:         MDM   1. Allergic conjunctivitis of both eyes    Obtain eyedrops called Zaditor and put 1 drop in each eye twice a day. Also obtain eyedrops called Systane and use as often as needed to lubricate the eye. You may also place a small amount of 1% hydrocortisone cream on the baggy part of the eyes sagging below the lower eyelid. Do not get into the eye. If not getting better this week call the ophthalmologist for appointment.    Janne Napoleon, NP 08/28/16 1657

## 2017-03-15 ENCOUNTER — Encounter (INDEPENDENT_AMBULATORY_CARE_PROVIDER_SITE_OTHER): Payer: Self-pay | Admitting: Physician Assistant

## 2017-03-15 ENCOUNTER — Ambulatory Visit (INDEPENDENT_AMBULATORY_CARE_PROVIDER_SITE_OTHER): Payer: Self-pay | Admitting: Physician Assistant

## 2017-03-15 VITALS — BP 113/74 | HR 77 | Temp 98.1°F | Resp 18 | Ht 64.0 in | Wt 183.0 lb

## 2017-03-15 DIAGNOSIS — Z23 Encounter for immunization: Secondary | ICD-10-CM

## 2017-03-15 DIAGNOSIS — Z1159 Encounter for screening for other viral diseases: Secondary | ICD-10-CM

## 2017-03-15 DIAGNOSIS — E781 Pure hyperglyceridemia: Secondary | ICD-10-CM

## 2017-03-15 DIAGNOSIS — F418 Other specified anxiety disorders: Secondary | ICD-10-CM

## 2017-03-15 DIAGNOSIS — H538 Other visual disturbances: Secondary | ICD-10-CM

## 2017-03-15 DIAGNOSIS — Z114 Encounter for screening for human immunodeficiency virus [HIV]: Secondary | ICD-10-CM

## 2017-03-15 MED ORDER — ESCITALOPRAM OXALATE 10 MG PO TABS: 10.0000 mg | ORAL_TABLET | Freq: Every day | ORAL | 1 refills | Status: DC

## 2017-03-15 MED ORDER — CLONAZEPAM 0.5 MG PO TABS: 0.5000 mg | ORAL_TABLET | Freq: Every day | ORAL | 0 refills | Status: DC

## 2017-03-15 NOTE — Patient Instructions (Signed)
Vivir con ansiedad  Living With Anxiety  Despus de haber sido diagnosticado con trastorno de ansiedad, podra sentirse aliviado por comprender por qu se haba sentido o haba actuado de cierto modo. Adems, es natural sentirse abrumado por el tratamiento que tiene por delante y por lo que este significar para su vida. Con atencin y ayuda, puede manejar este trastorno y recuperarse.  Cmo hacer frente a la ansiedad  Enfrentar el estrs  El estrs es la reaccin del cuerpo ante los cambios y los acontecimientos de la vida, tanto buenos como malos. El estrs puede durar solo algunas horas o puede ser permanente. El estrs puede influir mucho en la ansiedad, por lo que es importante aprender sobre cmo hacerle frente y cmo pensarlo de un modo nuevo.  Hable con el mdico o un orientador psicolgico para obtener ms informacin sobre cmo reducir el estrs. Podran sugerirle algunas tcnicas para hacerlo, como:   Musicoterapia. Esto podra incluir crear o escuchar msica que disfrute y lo inspire.   Meditacin consciente. Esto implica prestar atencin a la respiracin normal ms que intentar controlarla. Puede realizarse mientras est sentado o camina.   Oracin centrante. Este es un tipo de meditacin que implica centrarse en una palabra, frase o imagen sagrada que le sea representativa y le genere paz.   Respiracin profunda. Para hacer esto, expanda el estmago e inhale lentamente por la nariz. Mantenga el aire durante un lapso de entre 3y5segundos. Luego, exhale lentamente mientras deja que los msculos del estmago se relajen.   Dilogo interno. Se trata de una habilidad por la que usted es capaz de identificar patrones de pensamiento que lo llevan a tener reacciones ansiosas y de corregir dichos pensamientos.   Relajacin muscular. Esto implica tensar los msculos y, luego, relajarlos.    Elija una tcnica para reducir el estrs que se adapte a su estilo de vida y su personalidad. Las tcnicas para  reducir el estrs llevan tiempo y prctica. Resrvese de 5a15minutos por da para hacerlas. Algunos terapeutas pueden ofrecerle capacitacin para aprenderlas. Es posible que algunos planes de seguro mdico cubran la capacitacin. Otras cosas que puede hacer para manejar el estrs:   Llevar un registro del estrs. Esto puede ayudarlo a identificar qu le desencadena el estrs y modos de controlar su reaccin.   Pensar en cmo reacciona ante ciertas situaciones. Es posible que no sea capaz de controlar todo, pero puede controlar su reaccin.   Hacerse tiempo para las actividades que lo ayudan a relajarse y no sentir culpa por pasar su tiempo de este modo.    La terapia en combinacin con las habilidades para enfrentar y reducir el estrs proporciona la mejor alternativa para un tratamiento satisfactorio.  Medicamentos  Los medicamentos pueden ayudar a aliviar los sntomas. Algunos medicamentos para la ansiedad:   Medicamentos contra la ansiedad.   Antidepresivos.   Betabloqueantes.    Es posible que se requieran medicamentos, junto con la terapia, si otros tratamientos no dieron resultados. Un mdico debe recetar los medicamentos.  Las relaciones  Las relaciones interpersonales pueden ser muy importantes para ayudar a su recuperacin. Intente pasar ms tiempo interactuando con amigos y familiares de confianza. Considere la posibilidad de ir a terapia de pareja, tomar clases de educacin familiar o ir a terapia familiar. La terapia puede ayudarlos a usted y a los dems a comprender mejor el trastorno.  Cmo reconocer cambios en el trastorno  Todos tienen una respuesta diferente al tratamiento de la ansiedad. Se dice que est recuperado   de la ansiedad cuando los sntomas disminuyen y dejan de interferir en las actividades diarias en el hogar o el trabajo. Esto podra significar que usted comenzar a hacer lo siguiente:   Tener mejor concentracin y atencin.   Dormir mejor.   Estar menos irritable.   Tener  ms energa.   Tener mejor memoria.    Es importante reconocer cundo el trastorno empeora. Comunquese con el mdico si sus sntomas interfieren en su hogar o su trabajo, y usted no siente que el trastorno est mejorando.  Dnde encontrar ayuda y apoyo:  Puede conseguir ayuda y apoyo en los siguientes lugares:   Grupos de autoayuda.   Organizaciones comunitarias y en lnea.   Un lder espiritual de confianza.   Terapia de pareja.   Clases de educacin familiar.   Terapia familiar.    Siga estas instrucciones en su casa:   Consuma una dieta saludable que incluya abundantes frutas, verduras, cereales integrales, productos lcteos descremados y protenas magras. No consuma muchos alimentos con alto contenido de grasas slidas, azcares agregados o sal.   Actividad fsica. La mayora de los adultos debe hacer lo siguiente:  ? Realizar, al menos, 150minutos de actividad fsica por semana. El ejercicio debe aumentar la frecuencia cardaca y hacerlo transpirar (ejercicio de intensidad moderada).  ? Realizar ejercicios de fortalecimiento por lo menos dos veces por semana.   Disminuir el consumo de cafena, tabaco, alcohol y otras sustancias potencialmente dainas.   Dormir el tiempo adecuado y de la forma correcta. La mayora de los adultos necesitan entre 7y9horas de sueo todas las noches.   Opte por cosas que le simplifiquen la vida.   Tome los medicamentos de venta libre y los recetados solamente como se lo haya indicado el mdico.   Evite el consumo de cafena, alcohol y ciertos medicamentos contra el resfro de venta sin receta. Estos podran hacerlo sentir peor. Pregntele al farmacutico qu medicamentos no debera tomar.   Concurra a todas las visitas de control como se lo haya indicado el mdico. Esto es importante.  Preguntas para hacerle al mdico   Me ser til la terapia?   Con qu frecuencia debo visitar a un mdico para el seguimiento?   Durante cunto tiempo tendr que tomar los  medicamentos?   Tienen efectos secundarios a largo plazo los medicamentos que tomo?   Existe una alternativa que remplace los medicamentos?  Comunquese con un mdico si:   Le resulta difcil permanecer concentrado o finalizar las tareas diarias.   Pasa muchas horas por da sintindose preocupado por la vida cotidiana.   La preocupacin le provoca un cansancio extremo.   Comienza a tener dolores de cabeza o nuseas, o a sentirse tenso.   Orina ms de lo normal.   Tiene diarrea.  Solicite ayuda de inmediato si:   Se le acelera la frecuencia cardaca y le cuesta respirar.   Tiene pensamientos acerca de lastimarse o daar a otras personas.  Si alguna vez siente que puede lastimarse o lastimar a los dems, o tiene pensamientos de poner fin a su vida, busque ayuda de inmediato. Puede dirigirse al servicio de urgencias ms cercano o comunicarse con:   El servicio de emergencias de su localidad (911 en los [place]).   Una lnea de asistencia al suicida y atencin en crisis, como la Lnea Nacional de Prevencin del Suicidio (National Suicide Prevention Lifeline) al 1-800-273-8255. Est disponible las 24 horas del da.    Resumen   Tomar medidas para enfrentar el estrs

## 2017-03-15 NOTE — Progress Notes (Signed)
Subjective:  Patient ID: Marcus Solis, male    DOB: [birth year]  Age: 57 y.o. MRN: [id number]  CC: eye problem  HPI Marcus Solis is a 57 y.o. male with a medical history of left inguinal hernia, lumbago, open angle glaucoma, HLD, and allergic conjunctivitis of both eyes presents with concern of bags under the eyes. Says the bags started after treatment of posion ivy of the face many months ago. Thinks the steroid injection may have caused the bags under eyes. Went to [place] and had testing done to ascertain if he was retaining fluid under the eyes but the test was negative. However, he was found told he has open angle glaucoma which was reportedly treated and resolved. Not currently using any eye drops. Says his vision is blurred R > L because the poison ivy affected the right eye more. Patient reports difficulty sleeping. Admits to problems with anxiety. PHQ9 17 and GAD7 21 in clinic today.    Outpatient Medications Prior to Visit  Medication Sig Dispense Refill  . hydrOXYzine (ATARAX/VISTARIL) 25 MG tablet One to Two po q 6 hours prn rash 24 tablet 0  . predniSONE (STERAPRED UNI-PAK 21 TAB) 10 MG (21) TBPK tablet Take 6-5-4-3-2-1 po qd 21 tablet 0   No facility-administered medications prior to visit.      ROS Review of Systems  Constitutional: Negative for chills, fever and malaise/fatigue.  Eyes: Positive for blurred vision.  Respiratory: Negative for shortness of breath.   Cardiovascular: Negative for chest pain and palpitations.  Gastrointestinal: Negative for abdominal pain and nausea.  Genitourinary: Negative for dysuria and hematuria.  Musculoskeletal: Negative for joint pain and myalgias.  Skin: Negative for rash.  Neurological: Negative for tingling and headaches.  Psychiatric/Behavioral: Positive for depression. The patient is nervous/anxious.     Objective:  BP 113/74 (BP Location: Right Arm, Patient Position: Sitting, Cuff Size: Normal)   Pulse 77    Temp 98.1 F (36.7 C) (Oral)   Resp 18   Ht 5\' 4"  (1.626 m)   Wt 183 lb (83 kg)   SpO2 94%   BMI 31.41 kg/m   BP/Weight 03/15/2017 08/28/2016 2/44/0102  Systolic BP 725 366 440  Diastolic BP 74 75 79  Wt. (Lbs) 183 - -  BMI 31.41 - -      Physical Exam  Constitutional: He is oriented to person, place, and time.  Well developed, well nourished, NAD, polite  HENT:  Head: Normocephalic and atraumatic.  Mild periorbital swelling under the eyes  Eyes: Conjunctivae and EOM are normal. Pupils are equal, round, and reactive to light. No scleral icterus.  Neck: Normal range of motion. Neck supple. No thyromegaly present.  Cardiovascular: Normal rate, regular rhythm and normal heart sounds.  Pulmonary/Chest: Effort normal and breath sounds normal.  Abdominal: Soft. Bowel sounds are normal. There is no tenderness.  Musculoskeletal: He exhibits no edema.  Neurological: He is alert and oriented to person, place, and time.  Skin: Skin is warm and dry. No rash noted. No erythema. No pallor.  Psychiatric: His behavior is normal. Thought content normal.  Somewhat anxious  Vitals reviewed.    Assessment & Plan:    1. Blurring of visual image of both eyes - Referral to ophthalmologist  2. Hypertriglyceridemia - Lipid panel  3. Depression with anxiety - TSH - Comprehensive metabolic panel - CBC with Differential - escitalopram (LEXAPRO) 10 MG tablet; Take 1 tablet (10 mg total) by mouth daily.  Dispense: 30 tablet; Refill: 1 - clonazePAM (  KLONOPIN) 0.5 MG tablet; Take 1 tablet (0.5 mg total) by mouth at bedtime.  Dispense: 30 tablet; Refill: 0  4. Need for prophylactic vaccination and inoculation against influenza - Flu Vaccine QUAD 6+ mos PF IM (Fluarix Quad PF)  5. Encounter for hepatitis C screening test for low risk patient - Hepatitis panel, acute  6. Screening for HIV (human immunodeficiency virus) - HIV antibody   Meds ordered this encounter  Medications  .  escitalopram (LEXAPRO) 10 MG tablet    Sig: Take 1 tablet (10 mg total) by mouth daily.    Dispense:  30 tablet    Refill:  1    Order Specific Question:   Supervising Provider    Answer:   Tresa Garter [id number]  . clonazePAM (KLONOPIN) 0.5 MG tablet    Sig: Take 1 tablet (0.5 mg total) by mouth at bedtime.    Dispense:  30 tablet    Refill:  0    Order Specific Question:   Supervising Provider    Answer:   Tresa Garter [id number]    Follow-up: Return in about 6 weeks (around 04/26/2017).   Clent Demark PA

## 2017-03-16 ENCOUNTER — Other Ambulatory Visit (INDEPENDENT_AMBULATORY_CARE_PROVIDER_SITE_OTHER): Payer: Self-pay | Admitting: Physician Assistant

## 2017-03-16 DIAGNOSIS — E7841 Elevated Lipoprotein(a): Secondary | ICD-10-CM

## 2017-03-16 LAB — CBC WITH DIFFERENTIAL/PLATELET
Basophils Absolute: 0 10*3/uL (ref 0.0–0.2)
Basos: 0 %
EOS (ABSOLUTE): 0.1 10*3/uL (ref 0.0–0.4)
EOS: 1 %
HEMATOCRIT: 47.5 % (ref 37.5–51.0)
Hemoglobin: 16.4 g/dL (ref 13.0–17.7)
Immature Grans (Abs): 0 10*3/uL (ref 0.0–0.1)
Immature Granulocytes: 0 %
LYMPHS ABS: 2.6 10*3/uL (ref 0.7–3.1)
LYMPHS: 38 %
MCH: 30.4 pg (ref 26.6–33.0)
MCHC: 34.5 g/dL (ref 31.5–35.7)
MCV: 88 fL (ref 79–97)
Monocytes Absolute: 0.6 10*3/uL (ref 0.1–0.9)
Monocytes: 8 %
Neutrophils Absolute: 3.7 10*3/uL (ref 1.4–7.0)
Neutrophils: 53 %
Platelets: 296 10*3/uL (ref 150–379)
RBC: 5.4 x10E6/uL (ref 4.14–5.80)
RDW: 14.1 % (ref 12.3–15.4)
WBC: 7 10*3/uL (ref 3.4–10.8)

## 2017-03-16 LAB — COMPREHENSIVE METABOLIC PANEL
ALK PHOS: 78 IU/L (ref 39–117)
ALT: 45 IU/L — AB (ref 0–44)
AST: 35 IU/L (ref 0–40)
Albumin/Globulin Ratio: 1.8 (ref 1.2–2.2)
Albumin: 5 g/dL (ref 3.5–5.5)
BILIRUBIN TOTAL: 0.5 mg/dL (ref 0.0–1.2)
BUN / CREAT RATIO: 25 — AB (ref 9–20)
BUN: 22 mg/dL (ref 6–24)
CO2: 21 mmol/L (ref 20–29)
CREATININE: 0.88 mg/dL (ref 0.76–1.27)
Calcium: 10.1 mg/dL (ref 8.7–10.2)
Chloride: 101 mmol/L (ref 96–106)
GFR calc Af Amer: 110 mL/min/{1.73_m2} (ref >59)
GFR calc non Af Amer: 95 mL/min/{1.73_m2} (ref >59)
GLUCOSE: 92 mg/dL (ref 65–99)
Globulin, Total: 2.8 g/dL (ref 1.5–4.5)
Potassium: 4.2 mmol/L (ref 3.5–5.2)
Sodium: 138 mmol/L (ref 134–144)
Total Protein: 7.8 g/dL (ref 6.0–8.5)

## 2017-03-16 LAB — HEPATITIS PANEL, ACUTE
HEP A IGM: NEGATIVE
HEP B S AG: NEGATIVE
Hep B C IgM: NEGATIVE
Hep C Virus Ab: <0.1 s/co ratio (ref 0.0–0.9)

## 2017-03-16 LAB — LIPID PANEL
CHOL/HDL RATIO: 6.1 ratio — AB (ref 0.0–5.0)
Cholesterol, Total: 295 mg/dL — ABNORMAL HIGH (ref 100–199)
HDL: 48 mg/dL (ref >39)
LDL Calculated: 214 mg/dL — ABNORMAL HIGH (ref 0–99)
TRIGLYCERIDES: 167 mg/dL — AB (ref 0–149)
VLDL Cholesterol Cal: 33 mg/dL (ref 5–40)

## 2017-03-16 LAB — TSH: TSH: 3.4 u[IU]/mL (ref 0.450–4.500)

## 2017-03-16 LAB — HIV ANTIBODY (ROUTINE TESTING W REFLEX): HIV SCREEN 4TH GENERATION: NONREACTIVE

## 2017-03-16 MED ORDER — ATORVASTATIN CALCIUM 40 MG PO TABS
40.0000 mg | ORAL_TABLET | Freq: Every day | ORAL | 3 refills | Status: DC
Start: 2017-03-16 — End: 2017-04-26

## 2017-03-21 ENCOUNTER — Telehealth (INDEPENDENT_AMBULATORY_CARE_PROVIDER_SITE_OTHER): Payer: Self-pay | Admitting: *Deleted

## 2017-03-21 NOTE — Telephone Encounter (Signed)
-----   Message from Clent Demark, PA-C sent at 03/16/2017 12:25 PM EST ----- Cholesterol elevated. HIV and Hep A/B/C negative. Rest of labs normal. Please inform patient to pick up atorvastatin at the [facility] in [place].

## 2017-03-22 ENCOUNTER — Telehealth (INDEPENDENT_AMBULATORY_CARE_PROVIDER_SITE_OTHER): Payer: Self-pay | Admitting: *Deleted

## 2017-03-22 NOTE — Telephone Encounter (Signed)
-----   Message from Clent Demark, PA-C sent at 03/16/2017 12:25 PM EST ----- Cholesterol elevated. HIV and Hep A/B/C negative. Rest of labs normal. Please inform patient to pick up atorvastatin at the [facility] in [place].

## 2017-03-22 NOTE — Telephone Encounter (Signed)
Medical Assistant left message on patient's home and cell voicemail. Voicemail states to give a call back to [place] with [facility] at [phone]. Patient is aware of cholesterol being elevated and needing to pick up atorvastatin from [facility] on [address]. All other labs were negative and normal.

## 2017-03-30 ENCOUNTER — Ambulatory Visit: Payer: Self-pay | Attending: Physician Assistant

## 2017-04-26 ENCOUNTER — Encounter (INDEPENDENT_AMBULATORY_CARE_PROVIDER_SITE_OTHER): Payer: Self-pay | Admitting: Physician Assistant

## 2017-04-26 ENCOUNTER — Ambulatory Visit (INDEPENDENT_AMBULATORY_CARE_PROVIDER_SITE_OTHER): Payer: Self-pay | Admitting: Physician Assistant

## 2017-04-26 ENCOUNTER — Telehealth (INDEPENDENT_AMBULATORY_CARE_PROVIDER_SITE_OTHER): Payer: Self-pay | Admitting: Physician Assistant

## 2017-04-26 ENCOUNTER — Other Ambulatory Visit: Payer: Self-pay

## 2017-04-26 VITALS — BP 134/86 | HR 56 | Temp 98.0°F | Wt 183.4 lb

## 2017-04-26 DIAGNOSIS — Z9119 Patient's noncompliance with other medical treatment and regimen: Secondary | ICD-10-CM

## 2017-04-26 DIAGNOSIS — F418 Other specified anxiety disorders: Secondary | ICD-10-CM

## 2017-04-26 DIAGNOSIS — Z1211 Encounter for screening for malignant neoplasm of colon: Secondary | ICD-10-CM

## 2017-04-26 DIAGNOSIS — Z91199 Patient's noncompliance with other medical treatment and regimen due to unspecified reason: Secondary | ICD-10-CM

## 2017-04-26 DIAGNOSIS — Z760 Encounter for issue of repeat prescription: Secondary | ICD-10-CM

## 2017-04-26 DIAGNOSIS — R1032 Left lower quadrant pain: Secondary | ICD-10-CM

## 2017-04-26 MED ORDER — ESCITALOPRAM OXALATE 10 MG PO TABS: 10.0000 mg | ORAL_TABLET | Freq: Every day | ORAL | 2 refills | Status: DC

## 2017-04-26 MED ORDER — ATORVASTATIN CALCIUM 40 MG PO TABS: 40.0000 mg | ORAL_TABLET | Freq: Every day | ORAL | 3 refills | Status: DC

## 2017-04-26 MED ORDER — HYDROXYZINE HCL 25 MG PO TABS
ORAL_TABLET | ORAL | 0 refills | Status: DC
Start: 2017-04-26 — End: 2017-06-05

## 2017-04-26 MED FILL — ESCITALOPRAM 10 MG TABLET: 10 | 30 days supply | Qty: 30 | Fill #0

## 2017-04-26 MED FILL — ?ATORVASTATIN 40MG TABLET: 40 | 30 days supply | Qty: 30 | Fill #0

## 2017-04-26 MED FILL — hydrOXYzine HCL 25 MG TABS: 25 | 3 days supply | Qty: 30 | Fill #0

## 2017-04-26 NOTE — Telephone Encounter (Signed)
Pt want to request a referral for a dentist, please follow up

## 2017-04-26 NOTE — Progress Notes (Signed)
Subjective:  Patient ID: Marcus Solis, male    DOB: Jun 27, 1960  Age: 57 y.o. MRN: [id number]  CC: abdominal pain  HPI  Marcus Solis is a 57 y.o. male with a medical history of left inguinal hernia, lumbago, open angle glaucoma, HLD, and allergic conjunctivitis of both eyes presents with concern for recurrent hernia in the location of his previous hernia repair in the LLQ. Thinks his mesh may have failed. Could feel a outpouching in the location of pain. No f/c/n/v, bowel dysfunction, change in stool caliber, abdominal bloating,  Diagnosed with depression and anxiety 6 weeks ago. Prescribed Lexapro and Klonopin which he has not taken. Simply did not take and does not cite a reason but he promises to take as directed this time. Anxiety and depression continue as before.   Outpatient Medications Prior to Visit  Medication Sig Dispense Refill  . atorvastatin (LIPITOR) 40 MG tablet Take 1 tablet (40 mg total) by mouth daily. (Patient not taking: Reported on 04/26/2017) 90 tablet 3  . clonazePAM (KLONOPIN) 0.5 MG tablet Take 1 tablet (0.5 mg total) by mouth at bedtime. (Patient not taking: Reported on 04/26/2017) 30 tablet 0  . escitalopram (LEXAPRO) 10 MG tablet Take 1 tablet (10 mg total) by mouth daily. (Patient not taking: Reported on 04/26/2017) 30 tablet 1  . hydrOXYzine (ATARAX/VISTARIL) 25 MG tablet One to Two po q 6 hours prn rash (Patient not taking: Reported on 04/26/2017) 24 tablet 0   No facility-administered medications prior to visit.      ROS Review of Systems  Constitutional: Negative for chills, fever and malaise/fatigue.  Eyes: Negative for blurred vision.  Respiratory: Negative for shortness of breath.   Cardiovascular: Negative for chest pain and palpitations.  Gastrointestinal: Positive for abdominal pain. Negative for nausea.  Genitourinary: Negative for dysuria and hematuria.  Musculoskeletal: Negative for joint pain and myalgias.  Skin: Negative for  rash.  Neurological: Negative for tingling and headaches.  Psychiatric/Behavioral: Positive for depression. The patient is nervous/anxious.     Objective:  BP 134/86 (BP Location: Right Arm, Patient Position: Sitting, Cuff Size: Normal)   Pulse (!) 56   Temp 98 F (36.7 C) (Oral)   Wt 183 lb 6.4 oz (83.2 kg)   SpO2 92%   BMI 31.48 kg/m   BP/Weight 04/26/2017 03/15/2017 07/13/3014  Systolic BP 010 932 355  Diastolic BP 86 74 75  Wt. (Lbs) 183.4 183 -  BMI 31.48 31.41 -      Physical Exam  Constitutional: He is oriented to person, place, and time.  Well developed, well nourished, NAD, polite  HENT:  Head: Normocephalic and atraumatic.  Eyes: No scleral icterus.  Neck: Normal range of motion. Neck supple. No thyromegaly present.  Cardiovascular: Normal rate, regular rhythm and normal heart sounds.  Pulmonary/Chest: Effort normal and breath sounds normal.  Abdominal: Soft. Bowel sounds are normal. He exhibits mass (small area in LLQ that is somewhat firm with mild pain elicited on palpation). He exhibits no distension. There is tenderness (LLQ). There is no rebound and no guarding.  Musculoskeletal: He exhibits no edema.  Neurological: He is alert and oriented to person, place, and time. No cranial nerve deficit. Coordination normal.  Skin: Skin is warm and dry. No rash noted. No erythema. No pallor.  Psychiatric: His behavior is normal. Thought content normal.  Mild to moderately anxious  Vitals reviewed.    Assessment & Plan:    1. Depression with anxiety - Begin taking as directed. escitalopram (LEXAPRO)  10 MG tablet; Take 1 tablet (10 mg total) by mouth daily.  Dispense: 30 tablet; Refill: 2 - Begin. hydrOXYzine (ATARAX/VISTARIL) 25 MG tablet; One to Two po q 6 hours prn rash  Dispense: 30 tablet; Refill: 0  2. Left lower quadrant pain - CT Abdomen Pelvis W Contrast; Future  3. Screening for colon cancer - Ambulatory referral to Gastroenterology  4.  Noncompliance - Pt advised to take medications as directed.   5. Medication refill - atorvastatin (LIPITOR) 40 MG tablet; Take 1 tablet (40 mg total) by mouth daily.  Dispense: 90 tablet; Refill: 3   Meds ordered this encounter  Medications  . escitalopram (LEXAPRO) 10 MG tablet    Sig: Take 1 tablet (10 mg total) by mouth daily.    Dispense:  30 tablet    Refill:  2    Order Specific Question:   Supervising Provider    Answer:   Tresa Garter [id number]  . hydrOXYzine (ATARAX/VISTARIL) 25 MG tablet    Sig: One to Two po q 6 hours prn rash    Dispense:  30 tablet    Refill:  0    Order Specific Question:   Supervising Provider    Answer:   Tresa Garter [id number]  . atorvastatin (LIPITOR) 40 MG tablet    Sig: Take 1 tablet (40 mg total) by mouth daily.    Dispense:  90 tablet    Refill:  3    Order Specific Question:   Supervising Provider    Answer:   Tresa Garter [id number]    Follow-up: Return in about 6 weeks (around 06/07/2017) for Anxiety.   Clent Demark PA

## 2017-04-27 ENCOUNTER — Telehealth: Payer: Self-pay | Admitting: Physician Assistant

## 2017-04-27 NOTE — Telephone Encounter (Signed)
Needs to have insurance or orange card. Thank you.

## 2017-04-27 NOTE — Telephone Encounter (Signed)
LVM to call us back to informed what insurance he has if he has any

## 2017-04-27 NOTE — Telephone Encounter (Signed)
I have found out the only dental office that will accept the orange card is closed for renovations until August. There is no other option available. Sorry.

## 2017-04-27 NOTE — Telephone Encounter (Signed)
Pt called back to inform PCP he has the [place] until 10/10/2017. If needed, there is a copy in his profile under Demographics.

## 2017-04-30 ENCOUNTER — Ambulatory Visit (HOSPITAL_COMMUNITY)
Admission: RE | Admit: 2017-04-30 | Discharge: 2017-04-30 | Disposition: A | Discharge status: Home / Self Care | Payer: Medicaid Other | Source: Ambulatory Visit | Attending: Physician Assistant | Admitting: Physician Assistant

## 2017-04-30 DIAGNOSIS — R1032 Left lower quadrant pain: Secondary | ICD-10-CM | POA: Insufficient documentation

## 2017-04-30 DIAGNOSIS — R935 Abnormal findings on diagnostic imaging of other abdominal regions, including retroperitoneum: Secondary | ICD-10-CM | POA: Insufficient documentation

## 2017-04-30 MED ORDER — IOPAMIDOL (ISOVUE-300) INJECTION 61%
INTRAVENOUS | Status: AC
  Administered 2017-04-30: 100 mL
  Filled 2017-04-30: qty 100

## 2017-05-03 ENCOUNTER — Encounter: Payer: Self-pay | Admitting: Internal Medicine

## 2017-05-04 ENCOUNTER — Telehealth (INDEPENDENT_AMBULATORY_CARE_PROVIDER_SITE_OTHER): Payer: Self-pay

## 2017-05-04 NOTE — Telephone Encounter (Signed)
Using [phone]) with pacific interpreters left patient a voicemail informing of normal Xray, no acute findings. Return call to [facility] with any questions. Nat Christen, CMA

## 2017-05-04 NOTE — Telephone Encounter (Signed)
-----   Message from Clent Demark, PA-C sent at 05/03/2017  8:17 PM EDT ----- Normal. No acute findings.

## 2017-05-21 ENCOUNTER — Other Ambulatory Visit: Payer: Self-pay | Admitting: Nurse Practitioner

## 2017-05-21 ENCOUNTER — Ambulatory Visit (INDEPENDENT_AMBULATORY_CARE_PROVIDER_SITE_OTHER): Payer: Self-pay | Admitting: Nurse Practitioner

## 2017-05-21 ENCOUNTER — Other Ambulatory Visit: Payer: Self-pay

## 2017-05-21 ENCOUNTER — Encounter (INDEPENDENT_AMBULATORY_CARE_PROVIDER_SITE_OTHER): Payer: Self-pay | Admitting: Nurse Practitioner

## 2017-05-21 ENCOUNTER — Ambulatory Visit (HOSPITAL_COMMUNITY)
Admission: RE | Admit: 2017-05-21 | Discharge: 2017-05-21 | Disposition: A | Discharge status: Home / Self Care | Payer: Self-pay | Source: Ambulatory Visit | Attending: Nurse Practitioner | Admitting: Nurse Practitioner

## 2017-05-21 VITALS — BP 133/74 | HR 59 | Temp 97.8°F | Ht 64.0 in | Wt 179.6 lb

## 2017-05-21 DIAGNOSIS — M7989 Other specified soft tissue disorders: Secondary | ICD-10-CM | POA: Insufficient documentation

## 2017-05-21 DIAGNOSIS — M79661 Pain in right lower leg: Secondary | ICD-10-CM

## 2017-05-21 DIAGNOSIS — I70201 Unspecified atherosclerosis of native arteries of extremities, right leg: Secondary | ICD-10-CM | POA: Insufficient documentation

## 2017-05-21 MED ORDER — SULFAMETHOXAZOLE-TRIMETHOPRIM 800-160 MG PO TABS: 1.0000 | ORAL_TABLET | Freq: Two times a day (BID) | ORAL | 0 refills | Status: AC

## 2017-05-21 MED ORDER — IBUPROFEN 800 MG PO TABS: 800.0000 mg | ORAL_TABLET | Freq: Three times a day (TID) | ORAL | 0 refills | Status: DC | PRN

## 2017-05-21 MED FILL — SULFAMETHOXAZOLE-TMP DS TAB: 800-160 | 5 days supply | Qty: 10 | Fill #0

## 2017-05-21 MED FILL — IBUPROFEN 800 MG TABLET: 800 | 10 days supply | Qty: 30 | Fill #0

## 2017-05-21 NOTE — Progress Notes (Signed)
Assessment & Plan:  Diagnoses and all orders for this visit:  Pain and swelling of right lower leg -     sulfamethoxazole-trimethoprim (BACTRIM DS,SEPTRA DS) 800-160 MG tablet; Take 1 tablet by mouth 2 (two) times daily for 5 days. -     DG Tibia/Fibula Right; Future May alternate with ice application for pain relief. May also alternate with acetaminophen and Ibuprofen as prescribed for pain.    Patient has been counseled on age-appropriate routine health concerns for screening and prevention. These are reviewed and up-to-date. Referrals have been placed accordingly. Immunizations are up-to-date or declined.    Subjective:  No chief complaint on file.  HPI Marcus Solis 57 y.o. male presents to office today with right leg swelling.   Leg Swelling He was working on a Programmer, applications over a week ago and there was a piece of wood that flew off the machine and hit his right lower leg. He immediately felt pain and noticed bruising under the skin. His leg and foot also began to swell. Today he endorses decreased swelling in his right lower leg however this is a large, firm,  Swollen, painful golf ball sized area adjacent to the medial tibia.    Review of Systems  Constitutional: Negative for fever, malaise/fatigue and weight loss.  HENT: Negative.  Negative for nosebleeds.   Eyes: Negative.  Negative for blurred vision, double vision and photophobia.  Respiratory: Negative.  Negative for cough and shortness of breath.   Cardiovascular: Positive for leg swelling. Negative for chest pain and palpitations.  Gastrointestinal: Negative.  Negative for heartburn, nausea and vomiting.  Musculoskeletal: Positive for myalgias.  Neurological: Negative.  Negative for dizziness, focal weakness, seizures and headaches.  Psychiatric/Behavioral: Negative.  Negative for suicidal ideas.    Past Medical History:  Diagnosis Date  . Groin pain   . Left inguinal hernia 2015  . Lower abdominal pain    . Lumbago   . Sprain of lumbar region   . Unspecified disorder of male genital organs     Past Surgical History:  Procedure Laterality Date  . HERNIA REPAIR      Family History  Problem Relation Age of Onset  . Hyperlipidemia Mother   . Cancer Father   . Hypertension Sister     Social History Reviewed with no changes to be made today.   Outpatient Medications Prior to Visit  Medication Sig Dispense Refill  . atorvastatin (LIPITOR) 40 MG tablet Take 1 tablet (40 mg total) by mouth daily. 90 tablet 3  . escitalopram (LEXAPRO) 10 MG tablet Take 1 tablet (10 mg total) by mouth daily. 30 tablet 2  . hydrOXYzine (ATARAX/VISTARIL) 25 MG tablet One to Two po q 6 hours prn rash 30 tablet 0   No facility-administered medications prior to visit.     No Known Allergies     Objective:    BP 133/74 (BP Location: Right Arm, Patient Position: Sitting, Cuff Size: Normal)   Pulse (!) 59   Temp 97.8 F (36.6 C) (Oral)   Ht 5\' 4"  (1.626 m)   Wt 179 lb 9.6 oz (81.5 kg)   SpO2 95%   BMI 30.83 kg/m  Wt Readings from Last 3 Encounters:  05/21/17 179 lb 9.6 oz (81.5 kg)  04/26/17 183 lb 6.4 oz (83.2 kg)  03/15/17 183 lb (83 kg)    Physical Exam  Constitutional: He is oriented to person, place, and time. He appears well-developed and well-nourished. He is cooperative.  HENT:  Head: Normocephalic and atraumatic.  Cardiovascular: Normal rate, regular rhythm, normal heart sounds and intact distal pulses. Exam reveals no gallop and no friction rub.  No murmur heard. Pulmonary/Chest: Effort normal and breath sounds normal. No tachypnea. No respiratory distress. He has no decreased breath sounds. He has no wheezes. He has no rhonchi. He has no rales. He exhibits no tenderness.  Abdominal: Bowel sounds are normal.  Musculoskeletal: Normal range of motion. He exhibits edema, tenderness and deformity.       Legs: Neurological: He is alert and oriented to person, place, and time. Coordination  normal.  Skin: Skin is warm and dry.  Psychiatric: He has a normal mood and affect. His behavior is normal. Judgment and thought content normal.  Nursing note and vitals reviewed.     Patient has been counseled extensively about nutrition and exercise as well as the importance of adherence with medications and regular follow-up. The patient was given clear instructions to go to ER or return to medical center if symptoms don't improve, worsen or new problems develop. The patient verbalized understanding.   Follow-up: Return in about 2 weeks (around 06/04/2017) for right leg swelling.   Gildardo Pounds, FNP-BC [facility], [place]   05/21/2017, 3:08 PM

## 2017-05-28 ENCOUNTER — Telehealth: Payer: Self-pay

## 2017-05-28 NOTE — Telephone Encounter (Signed)
CMA attempt to call patient regarding X-ray results.  No answer and left a VM for patient to call back with the results.  Spanish interpreter [place] [phone] assist with the call.  If patient call back, please inform:  Your xray does not show any fracture. Continue to ice the area and finish your antibiotics as prescribed.

## 2017-05-28 NOTE — Telephone Encounter (Signed)
-----   Message from Gildardo Pounds, NP sent at 05/22/2017  9:07 AM EDT ----- Your xray does not show any fracture. Continue to ice the area and finish your antibiotics as prescribed.

## 2017-06-01 MED FILL — ESCITALOPRAM 10 MG TABLET: 10 | 30 days supply | Qty: 30 | Fill #1

## 2017-06-01 MED FILL — ?ATORVASTATIN 40MG TABLET: 40 | 30 days supply | Qty: 30 | Fill #1

## 2017-06-05 ENCOUNTER — Ambulatory Visit (INDEPENDENT_AMBULATORY_CARE_PROVIDER_SITE_OTHER): Payer: Self-pay

## 2017-06-05 DIAGNOSIS — Z1211 Encounter for screening for malignant neoplasm of colon: Secondary | ICD-10-CM

## 2017-06-05 MED ORDER — PEG 3350-KCL-NA BICARB-NACL 420 G PO SOLR: 4000.0000 mL | ORAL | 0 refills | Status: DC

## 2017-06-05 MED FILL — TRILYTE WITH FLAVOR PACKETS: 420 | 1 days supply | Qty: 4000 | Fill #0

## 2017-06-05 NOTE — Progress Notes (Signed)
Gastroenterology Pre-Procedure Review  Request Date:06/05/17 Requesting Physician: Dr.Flemming ( no previous tcs)  PATIENT REVIEW QUESTIONS: The patient responded to the following health history questions as indicated:    1. Diabetes Melitis: no 2. Joint replacements in the past 12 months: no 3. Major health problems in the past 3 months: no 4. Has an artificial valve or MVP: no 5. Has a defibrillator: no 6. Has been advised in past to take antibiotics in advance of a procedure like teeth cleaning: no 7. Family history of colon cancer: no  8. Alcohol Use: no 9. History of sleep apnea: no  10. History of coronary artery or other vascular stents placed within the last 12 months: no 11. History of any prior anesthesia complications: no    MEDICATIONS & ALLERGIES:    Patient reports the following regarding taking any blood thinners:   Plavix? no Aspirin? no Coumadin? no Brilinta? no Xarelto? no Eliquis? no Pradaxa? no Savaysa? no Effient? no  Patient confirms/reports the following medications:  Current Outpatient Medications  Medication Sig Dispense Refill  . atorvastatin (LIPITOR) 40 MG tablet Take 1 tablet (40 mg total) by mouth daily. 90 tablet 3  . escitalopram (LEXAPRO) 10 MG tablet Take 1 tablet (10 mg total) by mouth daily. 30 tablet 2   No current facility-administered medications for this visit.     Patient confirms/reports the following allergies:  No Known Allergies  No orders of the defined types were placed in this encounter.   AUTHORIZATION INFORMATION Primary Insurance: [employer] assistance   SCHEDULE INFORMATION: Procedure has been scheduled as follows:  Date: 08/03/17, Time: 12:00 Location: [facility] Dr.Rourk  This Gastroenterology Pre-Precedure Review Form is being routed to the following provider(s): Neil Crouch, PA

## 2017-06-05 NOTE — Patient Instructions (Addendum)
Marcus Solis   12-15-1960 MRN: [id number]    Procedure Date: 09/26/17 Time to register: 9:30am Place to register: [facility] Procedure Time: 10:30am Scheduled provider: R. Garfield Cornea, MD  PREPARATION FOR COLONOSCOPY WITH TRI-LYTE SPLIT PREP  Please notify us immediately if you are diabetic, take iron supplements, or if you are on Coumadin or any other blood thinners.     You will need to purchase 1 fleet enema and 1 box of Bisacodyl 62m tablets.   2 DAYS BEFORE PROCEDURE:  DATE: 09/24/17   DAY: Monday Begin clear liquid diet AFTER your lunch meal. NO SOLID FOODS after this point.  1 DAY BEFORE PROCEDURE:  DATE: 09/25/17   DAY: Tuesday Continue clear liquids the entire day - NO SOLID FOOD.     At 2:00 pm:  Take 2 Bisacodyl tablets.   At 4:00pm:  Start drinking your solution. Make sure you mix well per instructions on the bottle. Try to drink 1 (one) 8 ounce glass every 10-15 minutes until you have consumed HALF the jug. You should complete by 6:00pm.You must keep the left over solution refrigerated until completed next day.  Continue clear liquids. You must drink plenty of clear liquids to prevent dehyration and kidney failure. EXCEPTION: If you take medications for your heart, blood pressure or breathing, you may take these medications with a small amount of clear liquid.    DAY OF PROCEDURE:   DATE: 09/26/17   DAY: Wednesday    Five hours before your procedure time @ 5:30am:  Finish remaining amout of bowel prep, drinking 1 (one) 8 ounce glass every 10-15 minutes until complete. You have two hours to consume remaining prep.   Three hours before your procedure time _0 :30am:  Nothing by mouth.   At least one hour before going to the hospital:  Give yourself one Fleet enema. You may take your morning medications with sip of water unless we have instructed otherwise.      Please see below for Dietary Information.  CLEAR LIQUIDS INCLUDE:  Water  Jello (NOT red in color)   Ice Popsicles (NOT red in color)   Tea (sugar ok, no milk/cream) Powdered fruit flavored drinks  Coffee (sugar ok, no milk/cream) Gatorade/ Lemonade/ Kool-Aid  (NOT red in color)   Juice: apple, white grape, white cranberry Soft drinks  Clear bullion, consomme, broth (fat free beef/chicken/vegetable)  Carbonated beverages (any kind)  Strained chicken noodle soup Hard Candy   Remember: Clear liquids are liquids that will allow you to see your fingers on the other side of a clear glass. Be sure liquids are NOT red in color, and not cloudy, but CLEAR.  DO NOT EAT OR DRINK ANY OF THE FOLLOWING:  Dairy products of any kind   Cranberry juice Tomato juice / V8 juice   Grapefruit juice Orange juice     Red grape juice  Do not eat any solid foods, including such foods as: cereal, oatmeal, yogurt, fruits, vegetables, creamed soups, eggs, bread, crackers, pureed foods in a blender, etc.   HELPFUL HINTS FOR DRINKING PREP SOLUTION:   Make sure prep is extremely cold. Mix and refrigerate the the morning of the prep. You may also put in the freezer.   You may try mixing some Crystal Light or Country Time Lemonade if you prefer. Mix in small amounts; add more if necessary.  Try drinking through a straw  Rinse mouth with water or a mouthwash between glasses, to remove after-taste.  Try  sipping on a cold beverage /ice/ popsicles between glasses of prep.  Place a piece of sugar-free hard candy in mouth between glasses.  If you become nauseated, try consuming smaller amounts, or stretch out the time between glasses. Stop for 30-60 minutes, then slowly start back drinking.        OTHER INSTRUCTIONS  You will need a responsible adult at least 58 years of age to accompany you and drive you home. This person must remain in the waiting room during your procedure. The hospital will cancel your procedure if you do not have a responsible adult with you.   1. Wear loose  fitting clothing that is easily removed. 2. Leave jewelry and other valuables at home.  3. Remove all body piercing jewelry and leave at home. 4. Total time from sign-in until discharge is approximately 2-3 hours. 5. You should go home directly after your procedure and rest. You can resume normal activities the day after your procedure. 6. The day of your procedure you should not:  Drive  Make legal decisions  Operate machinery  Drink alcohol  Return to work   You may call the office (Dept: [phone]) before 5:00pm, or page the doctor on call [phone]) after 5:00pm, for further instructions, if necessary.   Insurance Information YOU WILL NEED TO CHECK WITH YOUR INSURANCE COMPANY FOR THE BENEFITS OF COVERAGE YOU HAVE FOR THIS PROCEDURE.  UNFORTUNATELY, NOT ALL INSURANCE COMPANIES HAVE BENEFITS TO COVER ALL OR PART OF THESE TYPES OF PROCEDURES.  IT IS YOUR RESPONSIBILITY TO CHECK YOUR BENEFITS, HOWEVER, WE WILL BE GLAD TO ASSIST YOU WITH ANY CODES YOUR INSURANCE COMPANY MAY NEED.    PLEASE NOTE THAT MOST INSURANCE COMPANIES WILL NOT COVER A SCREENING COLONOSCOPY FOR PEOPLE UNDER THE AGE OF 50  IF YOU HAVE BCBS INSURANCE, YOU MAY HAVE BENEFITS FOR A SCREENING COLONOSCOPY BUT IF POLYPS ARE FOUND THE DIAGNOSIS WILL CHANGE AND THEN YOU MAY HAVE A DEDUCTIBLE THAT WILL NEED TO BE MET. SO PLEASE MAKE SURE YOU CHECK YOUR BENEFITS FOR A SCREENING COLONOSCOPY AS WELL AS A DIAGNOSTIC COLONOSCOPY.

## 2017-06-07 ENCOUNTER — Ambulatory Visit (INDEPENDENT_AMBULATORY_CARE_PROVIDER_SITE_OTHER): Payer: Self-pay | Admitting: Physician Assistant

## 2017-06-07 ENCOUNTER — Other Ambulatory Visit: Payer: Self-pay

## 2017-06-07 ENCOUNTER — Telehealth (INDEPENDENT_AMBULATORY_CARE_PROVIDER_SITE_OTHER): Payer: Self-pay | Admitting: Physician Assistant

## 2017-06-07 ENCOUNTER — Encounter (INDEPENDENT_AMBULATORY_CARE_PROVIDER_SITE_OTHER): Payer: Self-pay | Admitting: Physician Assistant

## 2017-06-07 VITALS — BP 111/66 | HR 55 | Temp 98.0°F | Ht 64.0 in | Wt 179.8 lb

## 2017-06-07 DIAGNOSIS — K580 Irritable bowel syndrome with diarrhea: Secondary | ICD-10-CM

## 2017-06-07 DIAGNOSIS — F411 Generalized anxiety disorder: Secondary | ICD-10-CM

## 2017-06-07 DIAGNOSIS — T148XXA Other injury of unspecified body region, initial encounter: Secondary | ICD-10-CM

## 2017-06-07 DIAGNOSIS — Z760 Encounter for issue of repeat prescription: Secondary | ICD-10-CM

## 2017-06-07 MED ORDER — DICYCLOMINE HCL 20 MG PO TABS: 20.0000 mg | ORAL_TABLET | Freq: Three times a day (TID) | ORAL | 1 refills | Status: DC

## 2017-06-07 MED ORDER — NAPROXEN 500 MG PO TABS: 500.0000 mg | ORAL_TABLET | Freq: Two times a day (BID) | ORAL | 0 refills | Status: DC

## 2017-06-07 MED ORDER — ESCITALOPRAM OXALATE 20 MG PO TABS: 20.0000 mg | ORAL_TABLET | Freq: Every day | ORAL | 5 refills | Status: DC

## 2017-06-07 MED ORDER — ATORVASTATIN CALCIUM 40 MG PO TABS: 40.0000 mg | ORAL_TABLET | Freq: Every day | ORAL | 11 refills | Status: DC

## 2017-06-07 MED FILL — DICYCLOMINE HCL 20 MG TABS: 20 | 30 days supply | Qty: 90 | Fill #0

## 2017-06-07 MED FILL — NAPROXEN 500 MG TABLET: 500 | 14 days supply | Qty: 28 | Fill #0

## 2017-06-07 NOTE — Telephone Encounter (Signed)
Pt called back and received results left by the nurse, had no further questions and will see his PCP today

## 2017-06-07 NOTE — Progress Notes (Signed)
Subjective:  Patient ID: Marcus Solis, male    DOB: 20-Apr-1960  Age: 57 y.o. MRN: [id number]  CC: f/u RLE pain  HPI Marcus Arango-Ramirezis a 57 y.o.malewith a medical history of anxiety, left inguinal hernia, lumbago, open angle glaucoma,HLD,and allergic conjunctivitis of both eyes presents on f/u of RLE mass. XR tib/fib negative. Ecchymosis has subsided but still feels a knot in the calf. Only minimally tender to firm palpation. Does not interfere with gait. Denies increased warmth, bleeding, suppuration, SOB, f/c/n/v, or pain elsewhere in leg.     Patient states his anxiety is mild to moderately better with escitalopram 10 mg. Says his sleep pattern is normal now and feels well rested. Stools are loose and still has mucus in the stool occasionally. Went to a doctor in [place] and was told he had "irritation" in the bowels. Took a medication, can not remember name, which relieved his symptoms.     Outpatient Medications Prior to Visit  Medication Sig Dispense Refill  . atorvastatin (LIPITOR) 40 MG tablet Take 1 tablet (40 mg total) by mouth daily. 90 tablet 3  . escitalopram (LEXAPRO) 10 MG tablet Take 1 tablet (10 mg total) by mouth daily. 30 tablet 2  . polyethylene glycol-electrolytes (TRILYTE) 420 g solution Take 4,000 mLs by mouth as directed. (Patient not taking: Reported on 06/07/2017) 4000 mL 0   No facility-administered medications prior to visit.      ROS Review of Systems  Constitutional: Negative for chills, fever and malaise/fatigue.  Eyes: Negative for blurred vision.  Respiratory: Negative for shortness of breath.   Cardiovascular: Negative for chest pain and palpitations.  Gastrointestinal: Negative for abdominal pain and nausea.       Abdominal discomfort with mucus in stool  Genitourinary: Negative for dysuria and hematuria.  Musculoskeletal: Negative for joint pain and myalgias.  Skin: Negative for rash.  Neurological: Negative for tingling and  headaches.  Psychiatric/Behavioral: Negative for depression. The patient is nervous/anxious.     Objective:  BP 111/66 (BP Location: Left Arm, Patient Position: Sitting, Cuff Size: Normal)   Pulse (!) 55   Temp 98 F (36.7 C) (Oral)   Ht 5\' 4"  (1.626 m)   Wt 179 lb 12.8 oz (81.6 kg)   SpO2 95%   BMI 30.86 kg/m   BP/Weight 06/07/2017 05/21/2017 03/02/5807  Systolic BP 983 382 505  Diastolic BP 66 74 86  Wt. (Lbs) 179.8 179.6 183.4  BMI 30.86 30.83 31.48      Physical Exam  Constitutional: He is oriented to person, place, and time.  Well developed, overweight, NAD, polite  HENT:  Head: Normocephalic and atraumatic.  Eyes: No scleral icterus.  Neck: Normal range of motion. Neck supple. No thyromegaly present.  Pulmonary/Chest: Effort normal.  Musculoskeletal: He exhibits no edema.  Neurological: He is alert and oriented to person, place, and time.  Skin: Skin is warm and dry. No rash noted. No erythema. No pallor.  Psychiatric: He has a normal mood and affect. His behavior is normal. Thought content normal.  Vitals reviewed.    Assessment & Plan:    1. Hematoma - Seems to be resolved per history and pictures that were taken early in the injury. - Begin naproxen (NAPROSYN) 500 MG tablet; Take 1 tablet (500 mg total) by mouth 2 (two) times daily with a meal.  Dispense: 28 tablet; Refill: 0  2. Medication refill - Refill atorvastatin (LIPITOR) 40 MG tablet; Take 1 tablet (40 mg total) by mouth daily.  Dispense: 30  tablet; Refill: 11  3. Irritable bowel syndrome with diarrhea - Increase escitalopram - Begin Bentyl 20 mg one tab po ac TID PRN.   4. GAD (generalized anxiety disorder) - Increase escitalopram (LEXAPRO) 20 MG tablet; Take 1 tablet (20 mg total) by mouth daily.  Dispense: 30 tablet; Refill: 5   Meds ordered this encounter  Medications  . naproxen (NAPROSYN) 500 MG tablet    Sig: Take 1 tablet (500 mg total) by mouth 2 (two) times daily with a meal.     Dispense:  28 tablet    Refill:  0    Order Specific Question:   Supervising Provider    Answer:   Tresa Garter [id number]  . atorvastatin (LIPITOR) 40 MG tablet    Sig: Take 1 tablet (40 mg total) by mouth daily.    Dispense:  30 tablet    Refill:  11    Order Specific Question:   Supervising Provider    Answer:   Tresa Garter [id number]  . escitalopram (LEXAPRO) 20 MG tablet    Sig: Take 1 tablet (20 mg total) by mouth daily.    Dispense:  30 tablet    Refill:  5    Order Specific Question:   Supervising Provider    Answer:   Tresa Garter [id number]  . dicyclomine (BENTYL) 20 MG tablet    Sig: Take 1 tablet (20 mg total) by mouth 3 (three) times daily before meals.    Dispense:  90 tablet    Refill:  1    Order Specific Question:   Supervising Provider    Answer:   Tresa Garter [id number]    Follow-up: Return for anxiety.   Clent Demark PA

## 2017-06-07 NOTE — Patient Instructions (Signed)
Dieta para el sndrome del intestino irritable  (Diet for Irritable Bowel Syndrome)  Cuando se tiene sndrome del intestino irritable (SII), los alimentos que se consumen y los hbitos en cuanto a la alimentacin son muy importantes. El sndrome del intestino irritable puede causar diferentes sntomas, como dolor abdominal, estreimiento o diarrea. Elegir los alimentos adecuados puede ayudar a aliviar las molestias causadas por el sndrome del intestino irritable. Trabaje con el mdico y el nutricionista para encontrar el mejor plan de alimentacin que ayude a controlar los sntomas.  QU PAUTAS GENERALES DEBO SEGUIR?   Lleve un registro de alimentos. Esto ayudar a identificar los alimentos que causan sntomas. Escriba los siguientes datos:  ? Lo que comi y cundo.  ? Los sntomas que tiene.  ? Cundo se presentan los sntomas en relacin con las comidas.   Evite los alimentos que le ocasionen sntomas. Hable con el nutricionista sobre otras formas de obtener los mismos nutrientes que contienen estos alimentos.   Consuma ms alimentos que contengan fibra. Tome un suplemento de fibra si se lo indic el nutricionista.   Coma lentamente, en un clima distendido.   Intente consumir 5 o 6comidas pequeas por da. No saltee comidas.   Beba suficiente lquido para mantener la orina clara o de color amarillo plido.   Pregntele al mdico si debe tomar un probitico de venta libre durante las crisis para ayudar a recuperar las bacterias intestinales que son beneficiosas.   Si tiene clicos intestinales o diarrea, intente consumir alimentos con bajo contenido de grasa y alto contenido de carbohidratos. Los carbohidratos son, por ejemplo, pastas, arroz, panes y cereales integrales, frutas y verduras.   Si los productos lcteos reagudizan los sntomas, intente consumir menos cantidad. Es posible que el yogur le siente mejor que otros productos lcteos porque contiene bacterias que ayudan a la digestin.  QU  ALIMENTOS NO SE RECOMIENDAN?  Los siguientes son algunos alimentos y bebidas que pueden empeorar los sntomas:   Alimentos grasos, como papas fritas.   Productos lcteos, como queso o helado.   Chocolate.   Alcohol.   Productos que contengan cafena, como caf.   Bebidas gaseosas, como refrescos.  Los artculos mencionados arriba pueden no ser una lista completa de las bebidas y los alimentos que se deben evitar. Comunquese con su nutricionista para obtener ms informacin.  QU ALIMENTOS SON BUENAS FUENTES DE FIBRA?  El mdico o el nutricionista pueden recomendarle que consuma ms alimentos que contengan fibra. La fibra puede ayudar a aliviar el estreimiento y otros sntomas del sndrome del intestino irritable. Poco a poco, incorpore a la dieta alimentos que contengan fibra para que el organismo pueda acostumbrarse a ellos. El exceso de fibra de una vez puede causar gases e hinchazn abdominal. Los siguientes son algunos alimentos que son buenas fuentes de fibra:   Manzanas.   Duraznos.   Peras.   Frutos rojos.   Higos.   Brcoli (crudo).   Repollo.   Zanahorias.   Guisantes crudos.   Porotos colorados.   Porotos de [place].   Pan integral.   Cereal integral.  PARA OBTENER MS INFORMACIN  Fundacin Internacional para los Trastornos Gastrointestinales Funcionales (International Foundation for Functional Gastrointestinal Disorders): www.iffgd.org  Instituto Nacional de la Diabetes y las Enfermedades Digestivas y Renales (National Institute of Diabetes and Digestive and Kidney Diseases): www.niddk.nih.gov/health-information/health-topics/digestive-diseases/ibs/Pages/facts.aspx  Esta informacin no tiene como fin reemplazar el consejo del mdico. Asegrese de hacerle al mdico cualquier pregunta que tenga.  Document Released: 05/11/2008 Document Revised: 02/13/2014 Document Reviewed:   04/25/2013  Elsevier Interactive Patient Education  2018 [employer]

## 2017-06-07 NOTE — Telephone Encounter (Signed)
Noted  

## 2017-06-08 NOTE — Progress Notes (Signed)
Ok to schedule.

## 2017-07-25 MED FILL — ESCITALOPRAM 20 MG TABLET: 20 | 30 days supply | Qty: 30 | Fill #0

## 2017-07-25 MED FILL — DICYCLOMINE HCL 20 MG TABS: 20 | 30 days supply | Qty: 90 | Fill #1

## 2017-07-25 MED FILL — ?ATORVASTATIN 40MG TABLET: 40 | 30 days supply | Qty: 30 | Fill #2

## 2017-08-02 ENCOUNTER — Telehealth (INDEPENDENT_AMBULATORY_CARE_PROVIDER_SITE_OTHER): Payer: Self-pay | Admitting: Physician Assistant

## 2017-08-03 NOTE — Progress Notes (Signed)
Interpreter called for the pt, he did not complete his prep last night like he as directed. He told the interpreter that he never received instructions. He was given instructions at his nurse visit and we went over them with his family member. There is a paper scanned into epic that he signed stating he received and understood his instructions. I have rescheduled him for 09/26/17 at 10:30 and I will mail new instructions. Reviewed mailing address with interpreter. I called Kim[facility]LM to cancel today and reschedule for 09/26/17.

## 2017-08-15 NOTE — Telephone Encounter (Signed)
Patient called for medication refills, instructed patient to contact pharmacy for refills as all medication have several refills already. Marcus Solis

## 2017-09-06 ENCOUNTER — Ambulatory Visit (INDEPENDENT_AMBULATORY_CARE_PROVIDER_SITE_OTHER): Payer: Self-pay | Admitting: Physician Assistant

## 2017-09-06 ENCOUNTER — Encounter (INDEPENDENT_AMBULATORY_CARE_PROVIDER_SITE_OTHER): Payer: Self-pay | Admitting: Physician Assistant

## 2017-09-06 VITALS — BP 129/79 | HR 70 | Temp 98.2°F | Resp 16 | Ht 64.0 in | Wt 184.2 lb

## 2017-09-06 DIAGNOSIS — F329 Major depressive disorder, single episode, unspecified: Secondary | ICD-10-CM

## 2017-09-06 DIAGNOSIS — F411 Generalized anxiety disorder: Secondary | ICD-10-CM

## 2017-09-06 DIAGNOSIS — E7841 Elevated Lipoprotein(a): Secondary | ICD-10-CM

## 2017-09-06 DIAGNOSIS — K580 Irritable bowel syndrome with diarrhea: Secondary | ICD-10-CM

## 2017-09-06 DIAGNOSIS — F32A Depression, unspecified: Secondary | ICD-10-CM

## 2017-09-06 MED ORDER — HYDROXYZINE HCL 25 MG PO TABS: 25.0000 mg | ORAL_TABLET | Freq: Every day | ORAL | 1 refills | Status: DC

## 2017-09-06 MED ORDER — DICYCLOMINE HCL 20 MG PO TABS: 20.0000 mg | ORAL_TABLET | Freq: Three times a day (TID) | ORAL | 1 refills | Status: DC

## 2017-09-06 MED ORDER — ESCITALOPRAM OXALATE 20 MG PO TABS: 20.0000 mg | ORAL_TABLET | Freq: Every day | ORAL | 2 refills | Status: DC

## 2017-09-06 MED FILL — hydrOXYzine HCL 25 MG TABS: 25 | 30 days supply | Qty: 30 | Fill #0

## 2017-09-06 MED FILL — ESCITALOPRAM 20 MG TABLET: 20 | 30 days supply | Qty: 30 | Fill #0

## 2017-09-06 MED FILL — DICYCLOMINE HCL 20 MG TABS: 20 | 30 days supply | Qty: 90 | Fill #0

## 2017-09-06 NOTE — Patient Instructions (Signed)
Vivir con depresin Living With Depression Todas las personas experimentan en algn momento de sus vidas decepciones, tristeza y prdidas. Si se siente deprimido o triste durante 2semanas seguidas, es posible que tenga depresin. La depresin puede afectar sus pensamientos y sentimientos, sus relaciones, actividades diarias y salud fsica. Es causada por cambios que modifican el funcionamiento del cerebro. Si es diagnosticado con depresin, Information systems manager qu tipo de depresin tiene y cul es el mejor tratamiento para su afeccin. Si vive con depresin, existen KB Home	[place] ayudarn a recuperarse y [place] para evitar que vuelva a padecerla. Cmo lidiar con los cambios en el estilo de vida Sobrellevar el estrs  El estrs es la reaccin del cuerpo a los cambios y eventos que suceden en la vida, tanto buenos [place]. Las situaciones estresantes pueden incluir, entre otras:  Charity fundraiser.  La muerte de un cnyuge.  Perder un Mat Carne.  Jubilarse.  Tener un beb.  El estrs puede durar unas pocas horas o puede ser continuo. El estrs puede desempear una funcin muy importante en la depresin, por lo que es importante no solo aprender a Conservation officer, nature estrs sino tambin a Nutritional therapist de [place]. Hable con el mdico o un psicoterapeuta si quiere obtener ms informacin acerca de reducir [employer] de estrs. Podrn sugerirle algunas tcnicas para reducir el estrs, como, por ejemplo:  Terapia musical. Esta incluye crear o escuchar msica. Elegir L[employer] guste y que lo inspire.  Meditacin consciente. Este tipo de meditacin se puede Regulatory affairs officer est sentado o camina. Implica estar consciente de su respiracin normal en vez de tratar de controlar su respiracin.  Centrarse en la oracin. Este es un tipo de meditacin que implica centrarse en la palabra o frase espiritual. Roselyn Meier [place], una frase o una imagen sagrada que sea importante para  usted y que le de paz.  Respiracin profunda. Para hacerla, expanda su estmago e inhale lentamente por la nariz. Mantenga la respiracin durante 3 a 5 segundos, luego exhale lentamente y [employer] msculos del estmago se relajen.  Relajacin muscular. Esto implica tensionar voluntariamente los msculos y [employer].  Elija una tcnica para reducir el estrs que se adapte a su estilo de vida y personalidad. Las tcnicas para reducir el estrs llevan tiempo y prctica para poder desarrollarlas. Disponga de 5 a 15 minutos por da para ponerlas en prctica. Los psicoterapeutas pueden ofrecer capacitacin en estas tcnicas. Algunos planes de seguro mdico cubren este tipo de capacitacin. Otras herramientas que puede usar para controlar el estrs, [place], entre otras:  Catering manager un registro del estrs. Esto puede ayudarle a identificar los disparadores del estrs y las maneras de Chief Technology Officer su respuesta a Orthoptist.  Entender cules son sus lmites y Software engineer que no a los requerimientos o eventos que lo conducen a Furniture conservator/restorer agenda demasiado repleta de [place].  Pensar acerca de cmo seran sus respuestas a ciertas situaciones. Es posible que no pueda controlar todo, Armed forces training and education officer s puede Scientist, research (physical sciences).  Aadir humor a su vida viendo comedias o programas cmicos de TV.  Hacerse tiempo para actividades que lo ayuden a Nurse, children's y a no sentirse culpable por pasar su tiempo de esa [employer].  Medicamentos El mdico podr indicarle ciertos medicamentos si cree que podrn ayudarle a mejorar su afeccin. Evite consumir alcohol y otras sustancias que puedan interferir con el funcionamiento adecuado de sus medicamentos (interaccin). Tambin es importante:  Hablar con su farmacutico o mdico sobre todos los [employer]  toma, sus posibles efectos secundarios y los medicamentos que son seguros de tomar al [employer].  Sea parte de todas las decisiones acerca de su tratamiento (toma de decisiones  compartida). Esto incluye informar [employer] secundarios de los medicamentos. Es mejor si comparte las decisiones con su mdico como parte de su tratamiento general.  Si el mdico le indica un medicamento, es posible que no note los beneficios completos hasta despus de transcurridas 4 a 8semanas. La mayora de las personas que son tratadas por depresin necesitan continuar medicadas durante al menos 6 a 8meses despus de Sports administrator. Si toma medicamentos como parte de su tratamiento, no los suspenda sin conversarlo primero con Probation officer. Es posible que deba disminuir la dosis del Chief of Staff (disminucin progresiva) con el transcurso del tiempo para reducir el riesgo de efectos secundarios peligrosos. Las Walt Disney mdico puede sugerirle que haga terapia familiar junto con la individual e indicarle un TEFL teacher. Si bien puede que no haya problemas familiares que estn causando su depresin, es importante asegurarse de que su familia disponga de la mayor informacin posible acerca de su salud mental. Raynelle Jan el apoyo de su familia puede contribuir a que el tratamiento sea exitoso. Cmo reconocer los cambios en su afeccin Cada persona tiene una respuesta distinta a los tratamientos para la depresin. La recuperacin de una depresin profunda sucede cuando hayan transcurrido Exelon Corporation sin presentar signos de depresin profunda. Esto significa que usted comenzar a:  Tener ms Curator.  Sentirse menos desesperanzado que hace 34meses atrs.  Tener ms energa.  Comer en exceso con menos frecuencia o tener un mejor apetito.  Tener mejor concentracin.  El mdico trabajar con usted para decidir los prximos pasos de su recuperacin. Tambin es Glass blower/designer cundo su afeccin empeora. Est atento a estos signos:  Estar fatigado o con poca energa.  Comer demasiado o muy poco.  Dormir demasiado o muy poco.  Sentirse  agotado, agitado o desesperanzado.  Tener dificultad para concentrarse o tomar decisiones.  Tener dolencias fsicas sin motivo.  Sentirse irritable, enojado o agresivo.  Solicite ayuda tan pronto como usted o sus familiares detecten que estos sntomas regresan. Cmo obtener soporte y [place] de otras personas Cmo hablar con amigos y familiares sobre su afeccin  Hablar con amigos y familiares sobre su afeccin puede ser una forma de obtener soporte y Fish farm manager. Hable con sus amigos de confianza o familiares, explqueles sus sntomas y cunteles que est tratando su depresin con un mdico. Recursos financieros No todos los planes de seguro cubren la atencin de la salud mental, por ello es importante consultar con su agente de seguros. Si pagar copagos o servicios de asesoramiento es un problema, busque un centro de atencin de salud mental local o en el distrito. Es posible que ofrezcan servicios pblicos de atencin de salud mental gratis o a muy bajo costo cuando usted no puede costear un mdico privado. Si toma medicamentos para la depresin, puede obtener la forma genrica que es mucho menos [place]. Algunos laboratorios de medicamentos prescriptos tambin ofrecen ayuda a los pacientes que no pueden costearse los medicamentos que necesitan. Siga estas instrucciones en su casa:  Duerma lo suficiente y lo mejor posible.  No consuma ms caf, tabaco, alcohol ni otras sustancias potencialmente peligrosas.  Intente ejercitarse, como caminar o levantar pesos pequeos.  Tome los medicamentos de venta libre y los recetados solamente como se lo haya indicado el mdico.  Consuma una dieta saludable que incluya  abundantes frutas, verduras, cereales integrales, productos lcteos descremados y protenas magras. No consuma muchos alimentos de alto contenido de grasas slidas, azcares agregados o sal.  Concurra a todas las visitas de control como se lo haya indicado el mdico. Esto es  importante. Comunquese con un mdico si:  Deja de tomar los medicamentos antidepresivos y tiene alguno de estos sntomas: ? Nuseas. ? Dolor de [place]. ? Se siente mareado. ? Escalofros y dolores corporales. ? Imposibilidad de dormir (insomnio).  Usted o algn amigo o familiar advierten que su depresin est empeorando. Solicite ayuda de inmediato si:  Tiene pensamientos acerca de Glass blower/designer o daar a Producer, television/film/video. Si alguna vez siente que puede lastimarse o Market researcher a los dems, o tiene pensamientos de poner fin a su vida, busque ayuda de inmediato. Puede dirigirse al servicio de urgencias ms cercano o comunicarse con:  El servicio de Multimedia programmer de su localidad (911 en los Estados [place]).  Una lnea de asistencia al suicida y Freight forwarder en crisis, como la Lincoln National Corporation de Prevencin del Suicidio (National Suicide Prevention Lifeline) al [phone]. Esta est disponible las 24 horas del da.  Resumen  Si vive con depresin, existen KB Home	[place] ayudarn a recuperarse y [place] para evitar que vuelva a padecerla.  Trabaje con su equipo de atencin mdica para crear un plan de control que incluya apoyo psicolgico, tcnicas de control del estrs y hbitos de vida saludables. Esta informacin no tiene Marine scientist el consejo del mdico. Asegrese de hacerle al mdico cualquier pregunta que tenga. Document Released: 05/02/2016 Document Revised: 05/02/2016 Document Reviewed: 05/02/2016 Elsevier Interactive Patient Education  2018 [place] con ansiedad Living With Anxiety Despus de haber sido diagnosticado con trastorno de ansiedad, podra sentirse aliviado por comprender por qu se haba sentido o haba actuado de cierto modo. Adems, es natural sentirse abrumado por el tratamiento que tiene por delante y por lo que este significar para su vida. Con atencin y [place], [place] trastorno y Marine scientist. Cmo hacer frente a la  ansiedad Enfrentar el estrs El estrs es la reaccin del cuerpo ante los cambios y los acontecimientos de la vida, tanto buenos [place]. El estrs puede durar solo algunas horas o puede ser [place]. El estrs puede influir mucho en la ansiedad, por lo que es importante aprender sobre cmo hacerle frente y cmo pensarlo de un modo nuevo. Hable con el mdico o un orientador psicolgico para obtener ms informacin sobre cmo Software engineer. Podran sugerirle algunas tcnicas para hacerlo, como:  Musicoterapia. Esto podra incluir crear o escuchar msica que disfrute y lo inspire.  Meditacin consciente. Esto implica prestar atencin a la respiracin normal ms que intentar controlarla. Puede realizarse mientras est sentado o camina.  Oracin centrante. Este es un tipo de meditacin que implica centrarse en una palabra, frase o imagen sagrada que le sea representativa y le genere paz.  Respiracin profunda. Para hacer esto, expanda el estmago e inhale lentamente por la nariz. Mantenga el aire durante un lapso de [place]. Luego, exhale lentamente mientras deja que los msculos del estmago se relajen.  Dilogo interno. Se trata de una habilidad por la que usted es capaz de identificar patrones de pensamiento que lo llevan a Best boy reacciones ansiosas y de corregir dichos pensamientos.  Relajacin muscular. Esto implica tensar los msculos y, [place], [place].  Elija una tcnica para reducir el estrs que se adapte a su estilo de vida y su personalidad. Las tcnicas para reducir Visual merchandiser  tiempo y Location manager. Resrvese de 5a1minutos por da para Ambulance person. Algunos terapeutas pueden ofrecerle capacitacin para aprenderlas. Es posible que algunos planes de seguro mdico cubran la capacitacin. Otras cosas que puede hacer para manejar el estrs:  Catering manager un registro del estrs. Esto puede ayudarlo a Financial planner estrs y modos de Chief Technology Officer su  reaccin.  Pensar en cmo reacciona ante ciertas situaciones. Es posible que no sea capaz de Chief Technology Officer todo, pero puede controlar su reaccin.  Hacerse tiempo para las actividades que lo ayudan a Nurse, children's y no sentir culpa por pasar su tiempo de [place].  La terapia en combinacin con las habilidades para enfrentar y reducir el estrs proporciona la mejor alternativa para un tratamiento satisfactorio. Medicamentos Los medicamentos pueden ayudar a [employer]. [employer]. Algunos medicamentos para la ansiedad:  Teacher, adult education ansiedad.  Antidepresivos.  Betabloqueantes.  Es posible que se requieran medicamentos, junto con la terapia, si otros tratamientos no dieron [place]. Un mdico debe recetar los medicamentos. [place] interpersonales pueden ser muy importantes para ayudar a su recuperacin. Intente pasar ms tiempo interactuando con amigos y familiares de [place]. Considere la posibilidad de ir a terapia de pareja, tomar clases de educacin familiar o ir a Careers information officer. La terapia puede ayudarlos a usted y a los dems a comprender mejor el trastorno. Cmo reconocer cambios en el trastorno Todos tienen una respuesta diferente al tratamiento de la ansiedad. Se dice que est recuperado de la ansiedad cuando los sntomas disminuyen y dejan de Cabin crew en las actividades diarias en el hogar o [place]. Esto podra significar que usted comenzar a Field seismologist lo siguiente:  Associate Professor y atencin.  Dormir mejor.  Estar menos irritable.  Tener ms energa.  Tener [employer].  Es Glass blower/designer cundo el trastorno [place]. Comunquese con el mdico si sus sntomas interfieren en su hogar o su trabajo, y usted no siente que el trastorno est mejorando. Dnde encontrar ayuda y 21: Puede conseguir ayuda y [employer] siguientes lugares:  Grupos de [place].  Organizaciones comunitarias y en lnea.  Un lder espiritual de  confianza.  Terapia de pareja.  Clases de educacin familiar.  Terapia familiar.  Siga estas instrucciones en su casa:  Consuma una dieta saludable que incluya abundantes frutas, verduras, cereales integrales, productos lcteos descremados y protenas magras. No consuma muchos alimentos con alto contenido de grasas slidas, azcares agregados o sal.  Actividad fsica. La [employer] de los adultos debe hacer lo siguiente: ? Optometrist, al [employer] [place], 156minutos de actividad fsica por semana. El ejercicio debe aumentar la frecuencia cardaca y Nature conservation officer transpirar (ejercicio de intensidad moderada). ? Realizar ejercicios de fortalecimiento por lo [employer] por semana.  Disminuir el consumo de cafena, tabaco, alcohol y otras sustancias potencialmente dainas.  Dormir el tiempo adecuado y de Cabin crew. La State Farm de los adultos necesitan entre 7y9horas de sueo todas las noches.  Opte por cosas que le simplifiquen la vida.  Tome los medicamentos de venta libre y los recetados solamente como se lo haya indicado el mdico.  Evite el consumo de cafena, alcohol y ciertos medicamentos contra el resfro de venta sin receta. Estos podran Engineer, building services. Pregntele al farmacutico qu medicamentos no debera tomar.  Concurra a todas las visitas de control como se lo haya indicado el mdico. Esto es importante. Preguntas para hacerle al mdico  Me ser til la terapia?  Con qu frecuencia debo visitar a un mdico para el seguimiento?  Durante cunto tiempo tendr Liberty Global?  Tienen efectos secundarios a American Standard Companies tomo?  Existe una alternativa que remplace los medicamentos? Comunquese con un mdico si:  Le resulta difcil permanecer concentrado o finalizar las tareas diarias.  Pasa muchas horas por da sintindose preocupado por la vida cotidiana.  La preocupacin le provoca un cansancio extremo.  Comienza a tener dolores de  [place] o nuseas, o a sentirse tenso.  Orina ms de lo normal.  Tiene diarrea. Solicite ayuda de inmediato si:  Se le acelera la frecuencia cardaca y Industrial/product designer.  Tiene pensamientos acerca de Glass blower/designer o daar a Producer, television/film/video. Si alguna vez siente que puede lastimarse o Market researcher a los dems, o tiene pensamientos de poner fin a su vida, busque ayuda de inmediato. Puede dirigirse al servicio de urgencias ms cercano o comunicarse con:  El servicio de Multimedia programmer de su localidad (911 en los [place]).  Una lnea de asistencia al suicida y Freight forwarder en crisis, como la Lincoln National Corporation de Prevencin del Suicidio (National Suicide Prevention Lifeline) al [phone]. Est disponible las 24 horas del da.  Resumen  Tomar medidas para enfrentar el estrs puede calmarlo.  Los medicamentos no pueden curar los trastornos de [place], Armed forces training and education officer pueden ayudar a E. I. du Pont.  Los familiares, los amigos y las parejas pueden tener un lugar importante en su recuperacin del trastorno de ansiedad. Esta informacin no tiene Marine scientist el consejo del mdico. Asegrese de hacerle al mdico cualquier pregunta que tenga. Document Released: 05/02/2016 Document Revised: 05/02/2016 Document Reviewed: 05/02/2016 Elsevier Interactive Patient Education  [employer].

## 2017-09-06 NOTE — Progress Notes (Signed)
Subjective:  Patient ID: Marcus Solis, male    DOB: 19-May-1960  Age: 57 y.o. MRN: [id number]  CC: anxiety  HPI  Marcus Arango-Ramirezis a 57 y.o.malewith a medical history of anxiety, left inguinal hernia, lumbago, open angle glaucoma,HLD,and allergic conjunctivitis of both eyes presents to f/u on anxiety. Says he is taking escitalopram 20 mg qday as directed and feels somewhat better. Wife accompanies patient and states he is somewhat less irritable.Wife states pt is always critical about how things are done around the house and loses patience quickly. Pt states he is "just fine" when people don't anger him and when he sleeps well. Has moderate difficulty in maintaining sleep some days out of the week. Does not know why he wakes up frequently but says he worries constantly about family and financial issues. PHQ9 15 and GAD7 14. Does not endorse any other complaints or symptoms.     Outpatient Medications Prior to Visit  Medication Sig Dispense Refill  . atorvastatin (LIPITOR) 40 MG tablet Take 1 tablet (40 mg total) by mouth daily. 30 tablet 11  . escitalopram (LEXAPRO) 20 MG tablet Take 1 tablet (20 mg total) by mouth daily. 30 tablet 5  . dicyclomine (BENTYL) 20 MG tablet Take 1 tablet (20 mg total) by mouth 3 (three) times daily before meals. (Patient not taking: Reported on 09/06/2017) 90 tablet 1   No facility-administered medications prior to visit.      ROS Review of Systems  Constitutional: Negative for chills, fever and malaise/fatigue.  Eyes: Negative for blurred vision.  Respiratory: Negative for shortness of breath.   Cardiovascular: Negative for chest pain and palpitations.  Gastrointestinal: Negative for abdominal pain and nausea.  Genitourinary: Negative for dysuria and hematuria.  Musculoskeletal: Negative for joint pain and myalgias.  Skin: Negative for rash.  Neurological: Negative for tingling and headaches.  Psychiatric/Behavioral: Negative for  depression. The patient is not nervous/anxious.     Objective:  BP 129/79 (BP Location: Left Arm, Patient Position: Sitting, Cuff Size: Normal)   Pulse 70   Temp 98.2 F (36.8 C) (Oral)   Resp 16   Ht 5\' 4"  (1.626 m)   Wt 184 lb 3.2 oz (83.6 kg)   SpO2 96%   BMI 31.62 kg/m   BP/Weight 09/06/2017 06/07/2017 9/92/4268  Systolic BP 341 962 229  Diastolic BP 79 66 74  Wt. (Lbs) 184.2 179.8 179.6  BMI 31.62 30.86 30.83      Physical Exam  Constitutional: He is oriented to person, place, and time.  Well developed, well nourished, NAD, polite  HENT:  Head: Normocephalic and atraumatic.  Eyes: No scleral icterus.  Neck: Normal range of motion. Neck supple. No thyromegaly present.  Cardiovascular: Normal rate, regular rhythm and normal heart sounds.  Pulmonary/Chest: Effort normal and breath sounds normal.  Abdominal: Soft. Bowel sounds are normal. There is no tenderness.  Musculoskeletal: He exhibits no edema.  Neurological: He is alert and oriented to person, place, and time.  Skin: Skin is warm and dry. No rash noted. No erythema. No pallor.  Psychiatric: He has a normal mood and affect. His behavior is normal. Thought content normal.  Judgment fair, insight poor  Vitals reviewed.    Assessment & Plan:    1. GAD (generalized anxiety disorder) - Begin hydrOXYzine (ATARAX/VISTARIL) 25 MG tablet; Take 1 tablet (25 mg total) by mouth at bedtime.  Dispense: 30 tablet; Refill: 1 - Refill escitalopram (LEXAPRO) 20 MG tablet; Take 1 tablet (20 mg total) by mouth daily.  Dispense: 30 tablet; Refill: 2 - Ambulatory referral to Psychiatry  2. Depression, unspecified depression type - Ambulatory referral to Psychiatry  3. Irritable bowel syndrome with diarrhea - Refill dicyclomine (BENTYL) 20 MG tablet; Take 1 tablet (20 mg total) by mouth 3 (three) times daily before meals.  Dispense: 90 tablet; Refill: 1  4. Elevated lipoprotein(a) - Lipid panel, future - Basic Metabolic Panel,  future     Meds ordered this encounter  Medications  . hydrOXYzine (ATARAX/VISTARIL) 25 MG tablet    Sig: Take 1 tablet (25 mg total) by mouth at bedtime.    Dispense:  30 tablet    Refill:  1    Order Specific Question:   Supervising Provider    Answer:   Charlott Rakes [[id number]]  . escitalopram (LEXAPRO) 20 MG tablet    Sig: Take 1 tablet (20 mg total) by mouth daily.    Dispense:  30 tablet    Refill:  2    Order Specific Question:   Supervising Provider    Answer:   Charlott Rakes [[id number]]  . dicyclomine (BENTYL) 20 MG tablet    Sig: Take 1 tablet (20 mg total) by mouth 3 (three) times daily before meals.    Dispense:  90 tablet    Refill:  1    Order Specific Question:   Supervising Provider    Answer:   Charlott Rakes [[id number]]    Follow-up: Return in about 3 months (around 11/29/2017) for depression with anxiety.   Clent Demark PA

## 2017-09-07 MED FILL — ?ATORVASTATIN 40MG TABLET: 40 | 30 days supply | Qty: 30 | Fill #3

## 2017-09-20 ENCOUNTER — Telehealth (INDEPENDENT_AMBULATORY_CARE_PROVIDER_SITE_OTHER): Payer: Self-pay | Admitting: Physician Assistant

## 2017-09-20 NOTE — Telephone Encounter (Signed)
Pt should be calling his gastroenterologist for these instructions and prescriptions.

## 2017-09-20 NOTE — Telephone Encounter (Signed)
Patient called requesting medication refill for Polyethylene Glycol-Electrolytes for his colonoscopy exam on 09-26-17 @ 9:30 am. Patient states that he had a appointment schedule before that and received a letter stating he would need to get his BP checked and he went, patient wants to know if he need to get his pressure checked just like the prior appointment or would he just go to his colonoscopy appintment.  Patient uses [facility], [address]  Please advice [phone]  Thank you Marcus Solis

## 2017-09-20 NOTE — Telephone Encounter (Signed)
FWD to PCP. Marcus Solis, CMA  

## 2017-09-21 NOTE — Telephone Encounter (Signed)
Call placed to patient using [place]) patients mobile number has a voicemail box that has not been setup yet. Called patients wife and the number is disconnected. Interpreter tried calling patients mobile once more and still no answer and can not leave message.

## 2017-09-24 ENCOUNTER — Telehealth: Payer: Self-pay | Admitting: Internal Medicine

## 2017-09-24 MED ORDER — PEG 3350-KCL-NA BICARB-NACL 420 G PO SOLR: 4000.0000 mL | ORAL | 0 refills | Status: DC

## 2017-09-24 MED FILL — PEG-3350 SOLUTION: 420 | 1 days supply | Qty: 4000 | Fill #0

## 2017-09-24 NOTE — Telephone Encounter (Signed)
Noted. RX cosigned.

## 2017-09-24 NOTE — Telephone Encounter (Signed)
Pt called asking if anyone in the office speaks Spanish. He is scheduled a colonoscopy with [facility] on 8/21 and needs help understanding his prep instructions. Almyra Free is going to call the Interpreter at [phone] to arrange something. Pt is aware we will be calling him back at [phone]

## 2017-09-24 NOTE — Telephone Encounter (Signed)
Called the interpretor line and spoke with Jenny Reichmann [phone]. We went over all his instructions and answered all his questions. Went over the dates and times he needs to start clear liquids and start his prep and when to be at the hospital. Pt stated he understood.  He had mixed up his prep the last time he was scheduled and had to throw it away. He needed a new prep sent to the pharmacy. I have sent this in for him.

## 2017-09-25 NOTE — Telephone Encounter (Signed)
Patient already aware. He came into office and front office staff informed him. Nat Christen, CMA

## 2017-09-26 ENCOUNTER — Other Ambulatory Visit: Payer: Self-pay

## 2017-09-26 ENCOUNTER — Encounter (HOSPITAL_COMMUNITY): Payer: Self-pay | Admitting: *Deleted

## 2017-09-26 ENCOUNTER — Ambulatory Visit (HOSPITAL_COMMUNITY)
Admission: RE | Admit: 2017-09-26 | Discharge: 2017-09-26 | Disposition: A | Discharge status: Home / Self Care | Payer: Self-pay | Source: Ambulatory Visit | Attending: Internal Medicine | Admitting: Internal Medicine

## 2017-09-26 ENCOUNTER — Encounter (HOSPITAL_COMMUNITY)
Admission: RE | Disposition: A | Discharge status: Home / Self Care | Payer: Self-pay | Source: Ambulatory Visit | Attending: Internal Medicine

## 2017-09-26 DIAGNOSIS — D122 Benign neoplasm of ascending colon: Secondary | ICD-10-CM

## 2017-09-26 DIAGNOSIS — Z87891 Personal history of nicotine dependence: Secondary | ICD-10-CM | POA: Insufficient documentation

## 2017-09-26 DIAGNOSIS — Z79899 Other long term (current) drug therapy: Secondary | ICD-10-CM | POA: Insufficient documentation

## 2017-09-26 DIAGNOSIS — D124 Benign neoplasm of descending colon: Secondary | ICD-10-CM | POA: Insufficient documentation

## 2017-09-26 DIAGNOSIS — Z1211 Encounter for screening for malignant neoplasm of colon: Secondary | ICD-10-CM | POA: Insufficient documentation

## 2017-09-26 DIAGNOSIS — Z1212 Encounter for screening for malignant neoplasm of rectum: Secondary | ICD-10-CM

## 2017-09-26 HISTORY — PX: COLONOSCOPY: SHX5424

## 2017-09-26 HISTORY — PX: POLYPECTOMY: SHX5525

## 2017-09-26 SURGERY — COLONOSCOPY
Anesthesia: Moderate Sedation

## 2017-09-26 MED ORDER — STERILE WATER FOR IRRIGATION IR SOLN
Status: DC | PRN
  Administered 2017-09-26: 1.5 mL

## 2017-09-26 MED ORDER — ONDANSETRON HCL 4 MG/2ML IJ SOLN
INTRAMUSCULAR | Status: AC
  Filled 2017-09-26: qty 2

## 2017-09-26 MED ORDER — MIDAZOLAM HCL 5 MG/5ML IJ SOLN
INTRAMUSCULAR | Status: AC
  Filled 2017-09-26: qty 5

## 2017-09-26 MED ORDER — ONDANSETRON HCL 4 MG/2ML IJ SOLN
INTRAMUSCULAR | Status: DC | PRN
  Administered 2017-09-26: 4 mg via INTRAVENOUS

## 2017-09-26 MED ORDER — MEPERIDINE HCL 100 MG/ML IJ SOLN
INTRAMUSCULAR | Status: DC | PRN
  Administered 2017-09-26: 25 mg via INTRAVENOUS

## 2017-09-26 MED ORDER — MEPERIDINE HCL 50 MG/ML IJ SOLN
INTRAMUSCULAR | Status: AC
  Filled 2017-09-26: qty 1

## 2017-09-26 MED ORDER — MIDAZOLAM HCL 5 MG/5ML IJ SOLN
INTRAMUSCULAR | Status: DC | PRN
  Administered 2017-09-26 (×2): 2 mg via INTRAVENOUS
  Administered 2017-09-26 (×2): 1 mg via INTRAVENOUS

## 2017-09-26 MED ORDER — SODIUM CHLORIDE 0.9 % IV SOLN
INTRAVENOUS | Status: DC
  Administered 2017-09-26: 1000 mL via INTRAVENOUS

## 2017-09-26 NOTE — Op Note (Signed)
[facility] Patient Name: Marcus Solis Procedure Date: 09/26/2017 10:39 AM MRN: [id number] Date of Birth: [birth year] Attending MD: Norvel Richards , MD CSN: [id number] Age: 57 Admit Type: Outpatient Procedure:                Colonoscopy Indications:              Screening for colorectal malignant neoplasm Providers:                Norvel Richards, MD, Lurline Del, RN, Aram Candela Referring MD:              Medicines:                Midazolam 6 mg IV, Meperidine 25 mg IV, Ondansetron                            4 mg IV Complications:            No immediate complications. Estimated Blood Loss:     Estimated blood loss was minimal. Procedure:                Pre-Anesthesia Assessment:                           - Prior to the procedure, a History and Physical                            was performed, and patient medications and                            allergies were reviewed. The patient's tolerance of                            previous anesthesia was also reviewed. The risks                            and benefits of the procedure and the sedation                            options and risks were discussed with the patient.                            All questions were answered, and informed consent                            was obtained. Prior Anticoagulants: The patient has                            taken no previous anticoagulant or antiplatelet                            agents. ASA Grade Assessment: II - A patient with  mild systemic disease. After reviewing the risks                            and benefits, the patient was deemed in                            satisfactory condition to undergo the procedure.                           After obtaining informed consent, the colonoscope                            was passed under direct vision. Throughout the                            procedure, the  patient's blood pressure, pulse, and                            oxygen saturations were monitored continuously. The                            CF-HQ190L ([id number]) scope was introduced through                            the anus and advanced to the the cecum, identified                            by appendiceal orifice and ileocecal valve. The                            colonoscopy was performed without difficulty. The                            patient tolerated the procedure well. The quality                            of the bowel preparation was adequate. Scope In: 11:07:16 AM Scope Out: 11:21:10 AM Scope Withdrawal Time: 0 hours 7 minutes 45 seconds  Total Procedure Duration: 0 hours 13 minutes 54 seconds  Findings:      The perianal and digital rectal examinations were normal.      A 5 mm polyp was found in the ascending colon. The polyp was sessile.       The polyp was removed with a cold snare. Resection and retrieval were       complete. Estimated blood loss was minimal.      The exam was otherwise without abnormality on direct and retroflexion       views. Impression:               - One 5 mm polyp in the ascending colon, removed                            with a cold snare. Resected and retrieved.                           -  The examination was otherwise normal on direct                            and retroflexion views. Moderate Sedation:      Moderate (conscious) sedation was administered by the endoscopy nurse       and supervised by the endoscopist. The following parameters were       monitored: oxygen saturation, heart rate, blood pressure, respiratory       rate, EKG, adequacy of pulmonary ventilation, and response to care.       Total physician intraservice time was 22 minutes. Recommendation:           - Patient has a contact number available for                            emergencies. The signs and symptoms of potential                            delayed  complications were discussed with the                            patient. Return to normal activities tomorrow.                            Written discharge instructions were provided to the                            patient.                           - Resume previous diet.                           - Continue present medications.                           - Await pathology results.                           - Repeat colonoscopy date to be determined after                            pending pathology results are reviewed for                            surveillance based on pathology results.                           - Return to GI office (date not yet determined). Procedure Code(s):        --- Professional ---                           [phone], Colonoscopy, flexible; with removal of                            tumor(s), polyp(s), or other lesion(s) by snare  technique                           G0500, Moderate sedation services provided by the                            same physician or other qualified health care                            professional performing a gastrointestinal                            endoscopic service that sedation supports,                            requiring the presence of an independent trained                            observer to assist in the monitoring of the                            patient's level of consciousness and physiological                            status; initial 15 minutes of intra-service time;                            patient age 15 years or older (additional time may                            be reported with [phone], as appropriate) Diagnosis Code(s):        --- Professional ---                           Z12.11, Encounter for screening for malignant                            neoplasm of colon                           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 All rights reserved. The codes documented in this report are preliminary and upon coder review may  be revised to meet current compliance requirements. Cristopher Estimable. Reham Slabaugh, MD Norvel Richards, MD 09/26/2017 11:25:59 AM This report has been signed electronically. Number of Addenda: 0

## 2017-09-26 NOTE — Discharge Instructions (Signed)
Colonoscopy Discharge Instructions  Read the instructions outlined below and refer to this sheet in the next few weeks. These discharge instructions provide you with general information on caring for yourself after you leave the hospital. Your doctor may also give you specific instructions. While your treatment has been planned according to the most current medical practices available, unavoidable complications occasionally occur. If you have any problems or questions after discharge, call Dr. Gala Romney at [phone]. ACTIVITY  You may resume your regular activity, but move at a slower pace for the next 24 hours.   Take frequent rest periods for the next 24 hours.   Walking will help get rid of the air and reduce the bloated feeling in your belly (abdomen).   No driving for 24 hours (because of the medicine (anesthesia) used during the test).    Do not sign any important legal documents or operate any machinery for 24 hours (because of the anesthesia used during the test).  NUTRITION  Drink plenty of fluids.   You may resume your normal diet as instructed by your doctor.   Begin with a light meal and progress to your normal diet. Heavy or fried foods are harder to digest and may make you feel sick to your stomach (nauseated).   Avoid alcoholic beverages for 24 hours or as instructed.  MEDICATIONS  You may resume your normal medications unless your doctor tells you otherwise.  WHAT YOU CAN EXPECT TODAY  Some feelings of bloating in the abdomen.   Passage of more gas than usual.   Spotting of blood in your stool or on the toilet paper.  IF YOU HAD POLYPS REMOVED DURING THE COLONOSCOPY:  No aspirin products for 7 days or as instructed.   No alcohol for 7 days or as instructed.   Eat a soft diet for the next 24 hours.  FINDING OUT THE RESULTS OF YOUR TEST Not all test results are available during your visit. If your test results are not back during the visit, make an appointment  with your caregiver to find out the results. Do not assume everything is normal if you have not heard from your caregiver or the medical facility. It is important for you to follow up on all of your test results.  SEEK IMMEDIATE MEDICAL ATTENTION IF:  You have more than a spotting of blood in your stool.   Your belly is swollen (abdominal distention).   You are nauseated or vomiting.   You have a temperature over 101.   You have abdominal pain or discomfort that is severe or gets worse throughout the day.    Polyp information provided   Further recommendations to follow pending review of pathology report   Colon Polyps Polyps are tissue growths inside the body. Polyps can grow in many places, including the large intestine (colon). A polyp may be a round bump or a mushroom-shaped growth. You could have one polyp or several. Most colon polyps are noncancerous (benign). However, some colon polyps can become cancerous over time. What are the causes? The exact cause of colon polyps is not known. What increases the risk? This condition is more likely to develop in people who:  Have a family history of colon cancer or colon polyps.  Are older than 6 or older than 45 if they are African American.  Have inflammatory bowel disease, such as ulcerative colitis or Crohn disease.  Are overweight.  Smoke cigarettes.  Do not get enough exercise.  Drink too much alcohol.  Eat a diet that is: ? High in fat and red meat. ? Low in fiber.  Had childhood cancer that was treated with abdominal radiation.  What are the signs or symptoms? Most polyps do not cause symptoms. If you have symptoms, they may include:  Blood coming from your rectum when having a bowel movement.  Blood in your stool.The stool may look dark red or black.  A change in bowel habits, such as constipation or diarrhea.  How is this diagnosed? This condition is diagnosed with a colonoscopy. This is a procedure  that uses a lighted, flexible scope to look at the inside of your colon. How is this treated? Treatment for this condition involves removing any polyps that are found. Those polyps will then be tested for cancer. If cancer is found, your health care provider will talk to you about options for colon cancer treatment. Follow these instructions at home: Diet  Eat plenty of fiber, such as fruits, vegetables, and whole grains.  Eat foods that are high in calcium and vitamin D, such as milk, cheese, yogurt, eggs, liver, fish, and broccoli.  Limit foods high in fat, red meats, and processed meats, such as hot dogs, sausage, bacon, and lunch meats.  Maintain a healthy weight, or lose weight if recommended by your health care provider. General instructions  Do not smoke cigarettes.  Do not drink alcohol excessively.  Keep all follow-up visits as told by your health care provider. This is important. This includes keeping regularly scheduled colonoscopies. Talk to your health care provider about when you need a colonoscopy.  Exercise every day or as told by your health care provider. Contact a health care provider if:  You have new or worsening bleeding during a bowel movement.  You have new or increased blood in your stool.  You have a change in bowel habits.  You unexpectedly lose weight. This information is not intended to replace advice given to you by your health care provider. Make sure you discuss any questions you have with your health care provider.    Colonoscopia en los adultos (Colonoscopy, Adult) Una colonoscopia es un examen que se realiza para examinar el intestino grueso. Se hace para comprobar si hay problemas, por ejemplo:  Bultos (tumores).  Crecimientos (plipos).  Hinchazn (inflamacin).  Una hemorragia. ANTES DEL [place] y bebida Siga las indicaciones del mdico respecto de las comidas y las bebidas. Estas indicaciones pueden incluir lo  siguiente:  [place] del procedimiento, siga una dieta con bajo contenido de [place]. ? No coma frutos secos. ? No coma semillas. ? No coma frutas disecadas. ? No coma frutas crudas. ? No coma verduras.  Siga una dieta de lquidos transparentes durante 1 a 3das antes del procedimiento. No beba lquidos con colorante rojo o morado. Beba solamente lquidos transparentes, por ejemplo: ? Caldos o sopas transparentes. ? Caf negro o t. ? Jugos transparentes. ? Refrescos o bebidas deportivas transparentes. ? Gelatina. ? Helados de agua.  El da del procedimiento, no coma ni beba nada durante las 2horas previas al procedimiento. Preparado intestinal Si le recetaron un preparado intestinal por va oral:  Tmelo como se lo haya indicado el mdico. A partir del da previo al procedimiento, tendr que tomar una gran cantidad de lquido. El lquido lo har defecar hasta que la materia fecal sea casi transparente o de color verdoso claro.  Si la piel o la zona anal se le irritan debido a la diarrea, puede hacer lo siguiente: ?  Limpie la zona con toallitas que contengan productos medicinales, por ejemplo, toallitas hmedas para adultos con aloe y vitaminaE. ? Aplquese un producto en la piel que suavice la zona, como vaselina.  Si vomita mientras toma el preparado intestinal, descanse durante un mximo de 47minutos. Luego comience a tomar el preparado nuevamente. Si sigue vomitando y no puede tomar el preparado intestinal sin vomitar, llame al mdico. Instrucciones generales  Consulte al mdico si debe cambiar o suspender sus medicamentos habituales. Esto es importante si toma medicamentos para la diabetes o anticoagulantes.  Haga planes para que una persona lo lleve a su casa desde el hospital o la clnica. PROCEDIMIENTO  Pueden colocarle una va intravenosa (IV) en una de las venas.  Recibir un medicamento como ayuda para relajarse (sedante).  Para reducir el riesgo de  infecciones: ? Los mdicos se lavarn las manos. ? Le lavarn la zona anal con jabn.  Le pedirn que se recueste de costado con las rodillas flexionadas.  El mdico tendr listo un tubo [place], delgado y flexible. El tubo tendr Carlota Raspberry y Hali Marry en el extremo.  Le colocarn el tubo en el ano.  El tubo se introducir suavemente en el intestino grueso.  Le pondrn aire en el intestino grueso para mantenerlo abierto. Es posible que sienta presin o clicos.  La cmara se usar para Artist.  Pueden extraerle Radford Pax de tejido del cuerpo para estudiarla con un microscopio (biopsia). Si se detecta la presencia de posibles problemas, se enviar el tejido a un laboratorio para ser [employer].  Si se encuentran pequeos crecimientos, el mdico puede extirparlos y analizarlos para Product manager presencia de cncer.  Lentamente se retirar Landscape architect en el ano. Este procedimiento puede variar segn el mdico y el hospital. DESPUS DEL PROCEDIMIENTO  El mdico lo controlar con frecuencia hasta que hayan desaparecido los efectos de los medicamentos administrados.  No conduzca durante 24horas despus del procedimiento.  Es posible que encuentre una pequea cantidad de sangre en la materia fecal.  Puede eliminar gases.  Puede sentir clicos o meteorismo leves en el abdomen.  Depende de usted [employer]  del procedimiento. Pregntele al mdico o consulte en el departamento que realiza el procedimiento cundo [employer].  Esta informacin no tiene Marine scientist el consejo del mdico. Asegrese de hacerle al mdico cualquier pregunta que tenga. Document Released: 05/10/2010 Document Revised: 01/28/2013 Document Reviewed: 04/06/2015 Elsevier Interactive Patient Education  2017 [place] en el colon (Colon Polyps) Los plipos son crecimientos de tejido dentro del cuerpo. Pueden crecer en muchos lugares,  incluso en el intestino grueso (colon). Un plipo puede ser un bulto redondo o un crecimiento fungiforme. Una persona podra tener uno o varios plipos. La mayora de los plipos son no cancerosos (benignos). Sin embargo, algunos plipos pueden convertirse en cancerosos con el tiempo. CAUSAS No se conoce la causa exacta de los plipos. FACTORES DE RIESGO Es ms probable que esta afeccin se manifieste en las personas que: Tienen antecedentes familiares de cncer de colon o de plipos en el colon. Son [place] de [place] o de [place] si son afroamericanas. Tienen enfermedad inflamatoria intestinal, como enfermedad de Crohn o colitis ulcerosa. Tienen sobrepeso. Fuman cigarrillos. No realizan suficiente actividad fsica. Beben alcohol en exceso. Consumen una dieta de estas caractersticas: Con alto contenido de grasa y carnes rojas. Con bajo contenido de Max. Tuvieron cncer en la niez que se trat con radiacin abdominal. SNTOMAS La  mayora de los plipos no causan sntomas. Si tiene sntomas, estos pueden incluir los siguientes: Sangrado rectal al defecar. Sangre en la materia fecal.Heces de color rojo oscuro o negro. Un cambio en los hbitos intestinales, como estreimiento o diarrea. DIAGNSTICO Esta afeccin se diagnostica con una colonoscopia. En este procedimiento se [place] un endoscopio flexible que emite luz para observar el interior del colon. TRATAMIENTO El tratamiento de esta afeccin incluye la extirpacin de los plipos que se encuentran. Estos plipos sern analizados luego para detectar la presencia de cncer. Si se confirma la presencia de cncer, el mdico hablar con usted sobre las opciones de tratamiento del cncer de colon. INSTRUCCIONES PARA EL CUIDADO EN EL HOGAR Dieta Coma gran cantidad de fibra, como frutas, verduras y Psychologist, prison and probation services. Consuma alimentos con alto contenido de calcio y vitaminaD, [place], [place], [place], [place], [place], pescado y  [place]. Limite el consumo de alimentos con alto contenido de grasa, carnes rojas y carnes procesadas, como perros calientes, salchichas, tocino y embutidos. Mantenga un peso saludable o baje de peso si el mdico se lo recomend. Instrucciones generales No fume cigarrillos. No beba alcohol en exceso. Concurra a todas las visitas de control como se lo haya indicado el mdico. Esto es importante. Esto incluye realizarse colonoscopias programadas con regularidad. Hable con el mdico acerca de cundo debe realizarse una colonoscopia. Haga actividad fsica todos los das o como se lo haya indicado el mdico. SOLICITE ATENCIN MDICA SI: [place] Comment aparecen hemorragias o las hemorragias son ms abundantes al defecar. Le aparece sangre en las heces o hay ms sangre en las heces. Tiene un cambio en los hbitos intestinales. [place] de peso inesperadamente. Esta informacin no tiene Marine scientist el consejo del mdico. Asegrese de hacerle al mdico cualquier pregunta que tenga. Document Released: 07/25/2011 Document Revised: 05/17/2015 Document Reviewed: 12/14/2014 Elsevier Interactive Patient Education  2018 [place] (Monitored Anesthesia Care) Anestesia es un trmino que se refiere a Scientist, research (physical sciences), procedimientos y [employer] que ayudan a una persona a Buyer, retail segura y cmoda durante un procedimiento mdico. La anestesia monitorizada, o sedacin, es un tipo de anestesia. El anestesista puede recomendar sedacin si usted se someter a un procedimiento para el que no necesita estar inconsciente, por ejemplo:  Ciruga de cataratas.  Procedimiento dental.  Biopsia.  Colonoscopia. Durante el procedimiento, puede recibir un medicamento que lo ayudar a Nurse, children's (sedante). Existen tres niveles de sedacin:  Sedacin leve. En este nivel, puede estar despierto y sentirse relajado. Podr seguir indicaciones.  Sedacin moderada. En este nivel, estar adormecido. Es  posible que no recuerde el procedimiento.  Sedacin profunda. En este nivel, estar dormido. No recordar el procedimiento. Cuanta ms cantidad de USAA administren, ms profundo ser su nivel de sedacin. Dependiendo de cmo responda al procedimiento, el anestesista puede cambiar su nivel de sedacin o el tipo de anestesia para atender sus necesidades. Un anestesista lo monitorizar con Freight forwarder. INFORME A SU MDICO:  Cualquier alergia que tenga.  Todos los [employer], incluidos vitaminas, hierbas, gotas oftlmicas, cremas y medicamentos de venta libre.  Uso de corticoides (por va oral o en crema).  Cualquier problema que usted o sus familiares hayan tenido con sedantes y anestsicos.  Enfermedades de la sangre que tenga.  Cirugas previas.  Cualquier afeccin que padezca, como apnea del sueo.  Si est embarazada o podra estarlo.  Consumo de cigarrillos, alcohol o drogas. RIESGOS Y COMPLICACIONES En general, se trata de un procedimiento seguro.  Sin embargo, pueden presentarse problemas, por ejemplo:  Recibir demasiado medicamento (sedacin excesiva).  Nuseas.  Reaccin alrgica a un medicamento.  Problemas para respirar. Si esto ocurre, es posible que se utilice un tubo respiratorio para ayudarlo a Ambulance person. El tubo se retirar cuando despierte y respire por su cuenta.  Problemas cardacos.  Problemas pulmonares. ANTES DEL PROCEDIMIENTO Mantenerse hidratado Siga las indicaciones del mdico acerca de la hidratacin, las cuales pueden incluir lo siguiente:  Hasta 2horas antes del procedimiento, puede beber lquidos transparentes, como agua, jugos frutales transparentes, caf negro y t solo. Restricciones en las comidas y bebidas [place] comidas y las bebidas, las cuales pueden incluir lo siguiente:  Ocho horas antes del procedimiento, deje de ingerir comidas o alimentos pesados,  por ejemplo, carne, alimentos fritos o alimentos grasos.  Seis horas antes del procedimiento, deje de ingerir comidas o alimentos livianos, como tostadas o cereales.  Seis horas antes del procedimiento, deje de beber [place] o bebidas que [employer].  Dos horas antes del procedimiento, deje de beber lquidos transparentes. Medicamentos Consulte al mdico si debe hacer o no lo siguiente:  Quarry manager o suspender los medicamentos que toma habitualmente. Esto es muy importante si toma medicamentos para la diabetes o anticoagulantes.  Tomar medicamentos como aspirina e ibuprofeno. Estos medicamentos pueden tener un efecto anticoagulante en la [place]. No tome estos medicamentos antes del procedimiento si el mdico le indica que no lo haga. Pruebas y exmenes  Le harn un examen fsico.  Posiblemente deba realizarse anlisis de sangre para evaluar lo siguiente: ? Cmo estn funcionando los riones y Engineer, maintenance (IT). ? Con qu eficacia coagula la sangre. Instrucciones generales  Haga planes para que una persona lo lleve a su casa desde el hospital o la clnica.  Si se ir a su casa inmediatamente despus del procedimiento, planifique que alguien se quede con usted durante 24horas. PROCEDIMIENTO  Le controlarn la presin arterial, la frecuencia cardaca, la respiracin, el nivel de dolor y el estado general.  [place] Comment colocarn una va intravenosa (IV) en una de las venas.  El Best boy medicamentos, segn sea necesario, para que se sienta cmodo durante el procedimiento. Esto puede implicar un cambio en el nivel de sedacin.  Se realizar el procedimiento mdico. DESPUS DEL PROCEDIMIENTO  Le controlarn la presin arterial, la frecuencia cardaca, la frecuencia respiratoria y Retail buyer de oxgeno en la sangre hasta que haya desaparecido el efecto de los medicamentos administrados.  No conduzca durante 24horas si le administraron un sedante.  Es posible que le suceda lo  siguiente: ? Sentirse adormecido, torpe o nauseoso. ? Olvidarse de lo que sucedi despus del procedimiento. ? Tener dolor de garganta si le colocaron un tubo respiratorio durante el procedimiento. ? Vomitar. Esta informacin no tiene Marine scientist el consejo del mdico. Asegrese de hacerle al mdico cualquier pregunta que tenga. Document Released: 05/20/2012 Document Revised: 02/13/2014 Document Reviewed: 05/16/2015 Elsevier Interactive Patient Education  [employer].

## 2017-09-26 NOTE — H&P (Signed)
@LOGO @   Primary Care Physician:  Clent Demark, PA-C Primary Gastroenterologist:  Dr. Gala Romney  Pre-Procedure History & Physical: HPI:  Marcus Solis is a 57 y.o. male is here for a screening colonoscopy.   Past Medical History:  Diagnosis Date  . Groin pain   . Left inguinal hernia 2015  . Lower abdominal pain   . Lumbago   . Sprain of lumbar region   . Unspecified disorder of male genital organs     Past Surgical History:  Procedure Laterality Date  . HERNIA REPAIR      Prior to Admission medications   Medication Sig Start Date End Date Taking? Authorizing Provider  atorvastatin (LIPITOR) 40 MG tablet Take 1 tablet (40 mg total) by mouth daily. 06/07/17  Yes Clent Demark, PA-C  polyethylene glycol-electrolytes (TRILYTE) 420 g solution Take 4,000 mLs by mouth as directed. 09/24/17  Yes Mahala Menghini, PA-C  dicyclomine (BENTYL) 20 MG tablet Take 1 tablet (20 mg total) by mouth 3 (three) times daily before meals. 09/06/17   Clent Demark, PA-C  escitalopram (LEXAPRO) 20 MG tablet Take 1 tablet (20 mg total) by mouth daily. 09/06/17   Clent Demark, PA-C  hydrOXYzine (ATARAX/VISTARIL) 25 MG tablet Take 1 tablet (25 mg total) by mouth at bedtime. 09/06/17   Clent Demark, PA-C    Allergies as of 06/05/2017  . (No Known Allergies)    Family History  Problem Relation Age of Onset  . Hyperlipidemia Mother   . Cancer Father   . Hypertension Sister     Social History   Socioeconomic History  . Marital status: Married    Spouse name: Not on file  . Number of children: Not on file  . Years of education: Not on file  . Highest education level: Not on file  Occupational History  . Not on file  Social Needs  . Financial resource strain: Not on file  . Food insecurity:    Worry: Not on file    Inability: Not on file  . Transportation needs:    Medical: Not on file    Non-medical: Not on file  Tobacco Use  . Smoking status: Former Research scientist (life sciences)  .  Smokeless tobacco: Never Used  . Tobacco comment: quit 10 years ago   Substance and Sexual Activity  . Alcohol use: No  . Drug use: No  . Sexual activity: Not on file  Lifestyle  . Physical activity:    Days per week: Not on file    Minutes per session: Not on file  . Stress: Not on file  Relationships  . Social connections:    Talks on phone: Not on file    Gets together: Not on file    Attends religious service: Not on file    Active member of club or organization: Not on file    Attends meetings of clubs or organizations: Not on file    Relationship status: Not on file  . Intimate partner violence:    Fear of current or ex partner: Not on file    Emotionally abused: Not on file    Physically abused: Not on file    Forced sexual activity: Not on file  Other Topics Concern  . Not on file  Social History Narrative  . Not on file    Review of Systems: See HPI, otherwise negative ROS  Physical Exam: BP 121/78   Pulse (!) 55   Temp 97.7 F (36.5 C) (Oral)  Resp 12   Ht 5\' 5"  (1.651 m)   Wt 81.6 kg   SpO2 99%   BMI 29.95 kg/m  General:   Alert,  Well-developed, well-nourished, pleasant and cooperative in NAD Lungs:  Clear throughout to auscultation.   No wheezes, crackles, or rhonchi. No acute distress. Heart:  Regular rate and rhythm; no murmurs, clicks, rubs,  or gallops. Abdomen:  Soft, nontender and nondistended. No masses, hepatosplenomegaly or hernias noted. Normal bowel sounds, without guarding, and without rebound.    Impression/Plan: Marcus Solis is now here to undergo a screening colonoscopy.  First ever average her screening examination.    Risks, benefits, limitations, imponderables and alternatives regarding colonoscopy have been reviewed with the patient. Questions have been answered. All parties agreeable.     Notice:  This dictation was prepared with Dragon dictation along with smaller phrase technology. Any transcriptional errors that  result from this process are unintentional and may not be corrected upon review.

## 2017-09-26 NOTE — Progress Notes (Signed)
Daughter on the way to pick patient and wife up for transport home.

## 2017-09-26 NOTE — Progress Notes (Signed)
Used [employer] [id number]

## 2017-10-01 ENCOUNTER — Encounter: Payer: Self-pay | Admitting: Internal Medicine

## 2017-10-01 ENCOUNTER — Encounter (HOSPITAL_COMMUNITY): Payer: Self-pay | Admitting: Internal Medicine

## 2017-10-02 ENCOUNTER — Other Ambulatory Visit (INDEPENDENT_AMBULATORY_CARE_PROVIDER_SITE_OTHER): Payer: Self-pay

## 2017-10-02 NOTE — Addendum Note (Signed)
Addended by: Renaldo Reel on: 10/02/2017 09:52 AM   Modules accepted: Orders

## 2017-10-02 NOTE — Progress Notes (Signed)
Pt here for fasting labs.

## 2017-10-03 ENCOUNTER — Telehealth (INDEPENDENT_AMBULATORY_CARE_PROVIDER_SITE_OTHER): Payer: Self-pay

## 2017-10-03 LAB — BASIC METABOLIC PANEL
BUN/Creatinine Ratio: 13 (ref 9–20)
BUN: 11 mg/dL (ref 6–24)
CALCIUM: 9.7 mg/dL (ref 8.7–10.2)
CO2: 25 mmol/L (ref 20–29)
CREATININE: 0.88 mg/dL (ref 0.76–1.27)
Chloride: 98 mmol/L (ref 96–106)
GFR calc Af Amer: 109 mL/min/{1.73_m2} (ref >59)
GFR calc non Af Amer: 95 mL/min/{1.73_m2} (ref >59)
GLUCOSE: 105 mg/dL — AB (ref 65–99)
Potassium: 4.8 mmol/L (ref 3.5–5.2)
Sodium: 138 mmol/L (ref 134–144)

## 2017-10-03 LAB — LIPID PANEL
CHOLESTEROL TOTAL: 200 mg/dL — AB (ref 100–199)
Chol/HDL Ratio: 4.9 ratio (ref 0.0–5.0)
HDL: 41 mg/dL (ref >39)
LDL Calculated: 110 mg/dL — ABNORMAL HIGH (ref 0–99)
Triglycerides: 247 mg/dL — ABNORMAL HIGH (ref 0–149)
VLDL Cholesterol Cal: 49 mg/dL — ABNORMAL HIGH (ref 5–40)

## 2017-10-03 NOTE — Telephone Encounter (Signed)
Call placed using pacific interpreter [phone]) left voicemail informing patient that cholesterol is still elevated but is much better than before. Advised to abstain from sweets and fats; consume more protein. Return call to [facility] at [phone] with any questions or concerns. Nat Christen, CMA

## 2017-10-03 NOTE — Telephone Encounter (Signed)
-----   Message from Clent Demark, PA-C sent at 10/03/2017  8:36 AM EDT ----- Cholesterol is elevated but much better than previous result. Pt must abstain from sweets and fats. Consume more protein.

## 2017-10-09 ENCOUNTER — Telehealth (INDEPENDENT_AMBULATORY_CARE_PROVIDER_SITE_OTHER): Payer: Self-pay | Admitting: Physician Assistant

## 2017-10-09 NOTE — Telephone Encounter (Signed)
Patient called to get results of his lab work. Front desk gave patient his results. Patient that stated that he felt very dizzy 2 days ago but states that after 4 days of colonoscopy was done he became dizzy and lasted for 3 days. Patient states 2 days ago he became dizzy again but after taking Dicyclomine. Patient states he stopped taking Dicyclomine and seemed like the dizziness faded away. Patient would like to know if he need to stop taking this medication.   Please Advise [phone]  Thank you Marcus Solis

## 2017-10-09 NOTE — Telephone Encounter (Signed)
Pt may discontinue Bentyl (dicyclomine).

## 2017-10-09 NOTE — Telephone Encounter (Signed)
FWD to PCP. Marcus Solis Toivo Bordon, CMA  

## 2017-10-10 NOTE — Telephone Encounter (Signed)
Front desk has called patient 3x and had no answer. I left voicemail stating he should stop taking Bentyl (Dicyclomine) and if he had further question to contact the office. Marcus Solis

## 2017-10-10 NOTE — Telephone Encounter (Signed)
Please inform patient. Marcus Solis

## 2017-10-18 ENCOUNTER — Ambulatory Visit: Payer: Self-pay | Attending: Physician Assistant

## 2017-10-29 ENCOUNTER — Encounter (INDEPENDENT_AMBULATORY_CARE_PROVIDER_SITE_OTHER): Payer: Self-pay

## 2017-10-29 ENCOUNTER — Ambulatory Visit (INDEPENDENT_AMBULATORY_CARE_PROVIDER_SITE_OTHER): Payer: Self-pay | Admitting: Physician Assistant

## 2017-10-29 ENCOUNTER — Encounter (INDEPENDENT_AMBULATORY_CARE_PROVIDER_SITE_OTHER): Payer: Self-pay | Admitting: Physician Assistant

## 2017-10-29 ENCOUNTER — Other Ambulatory Visit: Payer: Self-pay

## 2017-10-29 VITALS — BP 123/77 | HR 67 | Temp 97.8°F | Ht 65.0 in | Wt 182.8 lb

## 2017-10-29 DIAGNOSIS — Z23 Encounter for immunization: Secondary | ICD-10-CM

## 2017-10-29 DIAGNOSIS — Z91199 Patient's noncompliance with other medical treatment and regimen due to unspecified reason: Secondary | ICD-10-CM

## 2017-10-29 DIAGNOSIS — H539 Unspecified visual disturbance: Secondary | ICD-10-CM

## 2017-10-29 DIAGNOSIS — R6889 Other general symptoms and signs: Secondary | ICD-10-CM

## 2017-10-29 DIAGNOSIS — Z9119 Patient's noncompliance with other medical treatment and regimen: Secondary | ICD-10-CM

## 2017-10-29 NOTE — Patient Instructions (Signed)
Escitalopram tablets Qu es este medicamento? El ESCITALOPRAM se [place] para el tratamiento de la depresin y ciertos tipos de ansiedad. Este medicamento puede ser utilizado para otros usos; si tiene alguna pregunta consulte con su proveedor de atencin mdica o con su farmacutico. MARCAS COMUNES: Lexapro Qu le debo informar a mi profesional de la salud antes de tomar este medicamento? Necesita saber si usted presenta alguno de los siguientes problemas o situaciones: -trastorno bipolar o antecedentes familiares del trastorno bipolar -diabetes -glaucoma -enfermedad cardiaca -enfermedad renal o heptica -recibe tratamiento electroconvulsivo -convulsiones -ideas suicidas, planes o si usted o alguien de su familia ha intentado un suicidio previo -una reaccin alrgica o inusual al escitalopram, al medicamento relacionado citalopram, a otros medicamentos, alimentos, colorantes o conservadores -si est embarazada o buscando quedar embarazada -si est amamantando a un beb Cmo debo utilizar este medicamento? Tome este medicamento por va oral con un vaso de agua. Siga las instrucciones de la etiqueta del [place]. Puede tomarlo con o sin alimentos. Si el Transport planner, tmelo con alimentos. Tome su medicamento a intervalos regulares. No lo tome con una frecuencia mayor a la indicada. No deje de tomar [employer] de repente a menos que as lo indique su mdico. Dejar de Insurance account manager medicamento demasiado rpido puede causar efectos secundarios graves o podra empeorar su afeccin. Su farmacutico le dar una [place] del medicamento especial (MedGuide, nombre en ingls) con cada receta y en cada ocasin que la vuelva a surtir. Asegrese de leer esta informacin cada vez cuidadosamente. Hable con su pediatra para informarse acerca del uso de este medicamento en nios. Puede requerir atencin especial. Sobredosis: Pngase en contacto inmediatamente con un centro  toxicolgico o una sala de urgencia si usted cree que haya tomado demasiado medicamento. ATENCIN: [employer] es solo para usted. No comparta este medicamento con nadie. Qu sucede si me olvido de una dosis? Si olvida una dosis, tmela lo antes posible. Si es casi la hora de la prxima dosis, tome slo esa dosis. No tome dosis adicionales o dobles. Qu puede interactuar con este medicamento? No tome este medicamento con ninguno de los siguientes frmacos: ciertos medicamentos para infecciones micticas, tales como fluconazol, itraconazol, ketoconazol, posaconazol y voriconazol cisaprida citalopram dofetilida dronedarona linezolida IMAO, tales como [place], Eldepryl, Marplan, [place] y Parnate azul de metileno (inyectado en una vena) pimozida tioridazina ziprasidona Este medicamento tambin puede interactuar con los siguientes medicamentos: alcohol anfetaminas aspirina y medicamentos tipo aspirina carbamazepina ciertos medicamentos para la depresin, ansiedad o trastornos psicticos ciertos medicamentos para la migraa, tales como almotriptn, eletriptn, frovatriptn, naratriptn, rizatriptn, sumatriptn y zolmitriptn ciertos medicamentos para conciliar el sueo ciertos medicamentos que tratan o previenen cogulos sanguneos, tales como warfarina, enoxaparina, dalteparina cimetidina diurticos fentanilo furazolidona isoniazida litio metoprolol AINE, medicamentos para el dolor y la inflamacin, como ibuprofeno o naproxeno otros medicamentos que prolongan el intervalo QT (causan un ritmo cardiaco anormal) procarbazina rasagilina suplementos tales [place], kava kava y valeriana tramadol triptfano Puede ser que esta lista no menciona todas las posibles interacciones. Informe a su profesional de [employer]	[place] de [employer] productos a base de hierbas, medicamentos de [place] o suplementos nutritivos que est tomando. Si usted fuma, consume bebidas alcohlicas o si utiliza drogas ilegales,  indqueselo tambin a su profesional de [employer]	[place]. Algunas sustancias pueden interactuar con su medicamento. A qu debo estar atento al usar Coca-Cola? Informe a su mdico si sus sntomas no mejoran o si empeoran. Visite a su mdico o  a su profesional de la salud para Immunologist. Debido que puede ser necesario tomar este medicamento durante varias semanas para que sea posible observar sus efectos en forma [place], es importante que sigue su tratamiento como recetado por su mdico. Los pacientes y sus familias deben estar atentos si empeora la depresin o ideas suicidas. Tambin est atento a cambios repentinos o severos de emocin, tales como el sentirse ansioso, agitado, lleno de pnico, irritable, hostil, agresivo, impulsivo, inquietud severa, demasiado excitado y hiperactivo o dificultad para conciliar el sueo. Si esto ocurre, especialmente al comenzar con el tratamiento o al cambiar de dosis, comunquese con su profesional de [employer]	[place]. Puede experimentar somnolencia o mareos. No conduzca ni utilice maquinaria, ni haga nada que Associate Professor en estado de alerta hasta que sepa cmo le afecta este medicamento. No se siente ni se ponga de pie con rapidez, especialmente si es un paciente de edad avanzada. Esto reduce el riesgo de mareos o [employer]. El alcohol puede interferir con el efecto de este medicamento. Evite consumir bebidas alcohlicas. Se le podr secar la boca. Masticar chicle sin azcar, chupar caramelos duros y beber agua en abundancia le ayudar a mantener la boca hmeda. Si el problema no desaparece o es severo, consulte a su mdico. Qu efectos secundarios puedo tener al [employer] este medicamento? Efectos secundarios que debe informar a su mdico o a Barrister's clerk de la salud tan pronto como sea posible: Chief of Staff, como erupcin cutnea, comezn/picazn o urticarias, e hinchazn de la cara, los labios o la lengua ansiedad heces de color  negro y aspecto alquitranado cambios en la visin confusin estado de nimo elevado, menor necesidad de dormir, pensamientos acelerados, conducta impulsiva dolor ocular ritmo cardiaco rpido, irregular sensacin de desmayos o aturdimiento, cadas sensacin de agitacin, enojo o irritabilidad alucinaciones, prdida del contacto con la realidad prdida de equilibrio o coordinacin prdida de memoria ereccin dolorosa o prolongada inquietud, caminar de un lado a otro, incapacidad para quedarse quieto convulsiones rigidez de los [employer] ideas suicidas u otros cambios en el estado de nimo dificultad para conciliar el sueo sangrado o moretones inusuales cansancio o debilidad inusual vmito Efectos secundarios que generalmente no requieren atencin mdica (infrmelos a su mdico o a Barrister's clerk de la salud si persisten o si son molestos): cambios en el apetito cambios en el deseo o desempeo sexual dolor de cabeza aumento de la sudoracin indigestin, nuseas temblores Puede ser que esta lista no menciona todos los posibles efectos secundarios. Comunquese a su mdico por asesoramiento mdico [employer]. Usted puede informar los efectos secundarios a la FDA por telfono al 1-800-FDA-1088. Dnde debo guardar mi medicina? Mantngala fuera del alcance de los nios. [place] a [employer], entre 15 y 44 grados C (63 y 31 grados F). Deseche todo el medicamento que no haya utilizado, despus de la fecha de vencimiento. ATENCIN: Este folleto es un resumen. Puede ser que no cubra toda la posible informacin. Si usted tiene preguntas acerca de esta medicina, consulte con su mdico, su farmacutico o su profesional de Technical sales engineer.  2018 Elsevier/Gold Standard (2016-02-24 00:00:00)

## 2017-10-29 NOTE — Progress Notes (Signed)
Subjective:  Patient ID: Marcus Solis, male    DOB: February 28, 1960  Age: 57 y.o. MRN: [id number]  CC: dizziness after colonoscopy  HPI Marcus Arango-Ramirezis a 57 y.o.malewith a medical history ofanxiety,left inguinal hernia, lumbago, open angle glaucoma,HLD,and allergic conjunctivitis of both eyes presentswith dizziness after colonoscopy. Says dizziness began approximately 8 days after colonoscopy. Review of colonoscopy procedure notes reveals patient tolerated procedure well and there was minimal bleeding after polypectomy. Says the "dizziness" waxes and wanes since one week ago and is more prevalent during the mornings. Movements make dizziness worse. Staying in one position with eyes closed alleviates symptoms. There is associated visual disturbance with the dizziness. Feels as though his vision "shakes" and he has to intentionally focus to see images better. Says he abruptly stopped his Escitalopram 20 mg. Does not endorse any particular reason for stopping Escitalopram on his own accord. Does not endorse any other symptoms or complaints.      Outpatient Medications Prior to Visit  Medication Sig Dispense Refill  . atorvastatin (LIPITOR) 40 MG tablet Take 1 tablet (40 mg total) by mouth daily. 30 tablet 11  . escitalopram (LEXAPRO) 20 MG tablet Take 1 tablet (20 mg total) by mouth daily. 30 tablet 2  . hydrOXYzine (ATARAX/VISTARIL) 25 MG tablet Take 1 tablet (25 mg total) by mouth at bedtime. 30 tablet 1  . dicyclomine (BENTYL) 20 MG tablet Take 1 tablet (20 mg total) by mouth 3 (three) times daily before meals. (Patient not taking: Reported on 10/29/2017) 90 tablet 1  . polyethylene glycol-electrolytes (TRILYTE) 420 g solution Take 4,000 mLs by mouth as directed. (Patient not taking: Reported on 10/29/2017) 4000 mL 0   No facility-administered medications prior to visit.      ROS Review of Systems  Constitutional: Negative for chills, fever and malaise/fatigue.  Eyes:  Negative for blurred vision.       Visual blurring.  Respiratory: Negative for shortness of breath.   Cardiovascular: Negative for chest pain and palpitations.  Gastrointestinal: Negative for abdominal pain and nausea.  Genitourinary: Negative for dysuria and hematuria.  Musculoskeletal: Negative for joint pain and myalgias.  Skin: Negative for rash.  Neurological: Positive for dizziness. Negative for tingling and headaches.  Psychiatric/Behavioral: Negative for depression. The patient is not nervous/anxious.     Objective:  BP 123/77 (BP Location: Left Arm, Patient Position: Sitting, Cuff Size: Normal)   Pulse 67   Temp 97.8 F (36.6 C) (Oral)   Ht 5\' 5"  (1.651 m)   Wt 182 lb 12.8 oz (82.9 kg)   SpO2 93%   BMI 30.42 kg/m   BP/Weight 10/29/2017 10/05/5619 3/0/8657  Systolic BP 846 962 952  Diastolic BP 77 66 79  Wt. (Lbs) 182.8 180 184.2  BMI 30.42 29.95 31.62      Physical Exam  Constitutional: He is oriented to person, place, and time.  Well developed, well nourished, NAD, polite  HENT:  Head: Normocephalic and atraumatic.  TMs normal bilaterally  Eyes: No scleral icterus.  Neck: Normal range of motion. Neck supple. No thyromegaly present.  Cardiovascular: Normal rate, regular rhythm and normal heart sounds.  Pulmonary/Chest: Effort normal and breath sounds normal.  Musculoskeletal: He exhibits no edema.  Neurological: He is alert and oriented to person, place, and time.  Dix Hallpike negative. Gait normal.   Skin: Skin is warm and dry. No rash noted. No erythema. No pallor.  Psychiatric: He has a normal mood and affect. His behavior is normal. Thought content normal.  Vitals reviewed.  Assessment & Plan:   1. Symptom of drug withdrawal - Symptoms of Escitalopram withdrawal. Recommence Escitalopram 20 mg. Pt educated and told to comply with medication regimen. He has been noncompliant before.   2. Need for immunization against influenza - Flu Vaccine QUAD  36+ mos IM  3. Visual disturbance - Ambulatory referral to Ophthalmology  4. Noncompliance - Stopped Escitalopram 20 mg on his own accord.    Follow-up: Return if symptoms worsen or fail to improve.   Clent Demark PA

## 2017-11-23 ENCOUNTER — Ambulatory Visit: Payer: Self-pay | Attending: Physician Assistant

## 2017-11-26 ENCOUNTER — Ambulatory Visit: Payer: Self-pay | Attending: Physician Assistant

## 2017-12-04 ENCOUNTER — Ambulatory Visit: Payer: Self-pay

## 2017-12-10 ENCOUNTER — Ambulatory Visit (INDEPENDENT_AMBULATORY_CARE_PROVIDER_SITE_OTHER): Payer: Self-pay | Admitting: Physician Assistant

## 2017-12-26 ENCOUNTER — Other Ambulatory Visit: Payer: Self-pay

## 2017-12-26 ENCOUNTER — Telehealth: Payer: Self-pay | Admitting: Physician Assistant

## 2017-12-26 ENCOUNTER — Ambulatory Visit (HOSPITAL_COMMUNITY)
Admission: EM | Admit: 2017-12-26 | Discharge: 2017-12-26 | Disposition: A | Discharge status: Home / Self Care | Payer: Medicaid Other | Attending: Family Medicine | Admitting: Family Medicine

## 2017-12-26 ENCOUNTER — Encounter (HOSPITAL_COMMUNITY): Payer: Self-pay | Admitting: Emergency Medicine

## 2017-12-26 DIAGNOSIS — B029 Zoster without complications: Secondary | ICD-10-CM

## 2017-12-26 MED ORDER — DICLOFENAC SODIUM 75 MG PO TBEC: 75.0000 mg | DELAYED_RELEASE_TABLET | Freq: Two times a day (BID) | ORAL | 0 refills | Status: DC

## 2017-12-26 MED ORDER — CAPSAICIN 0.075 % EX CREA: 1.0000 "application " | TOPICAL_CREAM | Freq: Two times a day (BID) | CUTANEOUS | 0 refills | Status: DC

## 2017-12-26 MED ORDER — VALACYCLOVIR HCL 1 G PO TABS: 1000.0000 mg | ORAL_TABLET | Freq: Three times a day (TID) | ORAL | 0 refills | Status: DC

## 2017-12-26 MED FILL — ?VALACYCLOVIR HCL 1 GRAM TA: 1 | 7 days supply | Qty: 21 | Fill #0

## 2017-12-26 MED FILL — DICLOFENAC SOD EC 75 MG TAB: 75 | 7 days supply | Qty: 14 | Fill #0

## 2017-12-26 NOTE — Telephone Encounter (Signed)
Pt came in to request an update on his CAFA letter, he states his wife has received hers but has not heard anything back from his application. Please follow up

## 2017-12-26 NOTE — ED Triage Notes (Addendum)
Rash on lower back, center back and around to the right, blisters on right back/side.  Described as burning.  First noticed 6 days ago, reports rash into groin area on right side

## 2017-12-26 NOTE — ED Notes (Signed)
Day 1 -though it was a bite. Day 2-itching and rash, also noticed warm sensation on skin Day 4 - burning sensation from spine to testicular area

## 2017-12-26 NOTE — ED Provider Notes (Signed)
[place]   [id number] 12/26/17 Arrival Time: 0830  ASSESSMENT & PLAN:  1. Herpes zoster without complication   No obvious sign of insect bite. No sign of superficial skin infection or abscess. Discussed.  Meds ordered this encounter  Medications  . valACYclovir (VALTREX) 1000 MG tablet    Sig: Take 1 tablet (1,000 mg total) by mouth 3 (three) times daily.    Dispense:  21 tablet    Refill:  0  . capsicum (ZOSTRIX) 0.075 % topical cream    Sig: Apply 1 application topically 2 (two) times daily. For pain.    Dispense:  28.3 g    Refill:  0  . diclofenac (VOLTAREN) 75 MG EC tablet    Sig: Take 1 tablet (75 mg total) by mouth 2 (two) times daily.    Dispense:  14 tablet    Refill:  0   Written information given. Discussed typical duration of symptoms. Will follow up with PCP or here if worsening or failing to improve as anticipated. Declines work note.  Reviewed expectations re: course of current medical issues. Questions answered. Outlined signs and symptoms indicating need for more acute intervention. Patient verbalized understanding. After Visit Summary given.   SUBJECTIVE:  Marcus Solis is a 57 y.o. male who presents with a skin complaint. Spanish video interpreter used.   Location: R lower back extending to side Onset: gradual Duration: 5-6 days ago Associated pruritis?  mild Associated pain? Mild to moderate; "burning pain from the inside out" Progression: increasing steadily  Drainage? No  Known trigger? he questions insect bite to this area before noticing rash New soaps/lotions/topicals/detergents/environmental exposures? No Contacts with similar? No Recent travel? No  Other associated symptoms: none Therapies tried thus far: none Arthralgia or myalgia? none Recent illness? none Fever? none No specific aggravating or alleviating factors reported. No h/o similar. No abdominal pain. No n/v. Appetite normal. No CP or SOB.  ROS: As  per HPI. All other systems negative.   OBJECTIVE: Vitals:   12/26/17 0941  BP: 121/75  Pulse: 61  Resp: 16  Temp: 98.2 F (36.8 C)  TempSrc: Oral  SpO2: 100%    General appearance: alert; no distress Lungs: clear to auscultation bilaterally Heart: regular rate and rhythm Abd: soft; non-tender Extremities: no edema Skin: warm and dry; 3 distinct crops of red/purplish papules over right L1 dermatome from midline back extending around side; no crusting; no signs of trauma Psychological: alert and cooperative; normal mood and affect  No Known Allergies  Past Medical History:  Diagnosis Date  . Groin pain   . Left inguinal hernia 2015  . Lower abdominal pain   . Lumbago   . Sprain of lumbar region   . Unspecified disorder of male genital organs    Social History   Socioeconomic History  . Marital status: Married    Spouse name: Not on file  . Number of children: Not on file  . Years of education: Not on file  . Highest education level: Not on file  Occupational History  . Not on file  Social Needs  . Financial resource strain: Not on file  . Food insecurity:    Worry: Not on file    Inability: Not on file  . Transportation needs:    Medical: Not on file    Non-medical: Not on file  Tobacco Use  . Smoking status: Former Research scientist (life sciences)  . Smokeless tobacco: Never Used  . Tobacco comment: quit 10 years ago   Substance  and Sexual Activity  . Alcohol use: No  . Drug use: No  . Sexual activity: Not on file  Lifestyle  . Physical activity:    Days per week: Not on file    Minutes per session: Not on file  . Stress: Not on file  Relationships  . Social connections:    Talks on phone: Not on file    Gets together: Not on file    Attends religious service: Not on file    Active member of club or organization: Not on file    Attends meetings of clubs or organizations: Not on file    Relationship status: Not on file  . Intimate partner violence:    Fear of current or ex  partner: Not on file    Emotionally abused: Not on file    Physically abused: Not on file    Forced sexual activity: Not on file  Other Topics Concern  . Not on file  Social History Narrative  . Not on file   Family History  Problem Relation Age of Onset  . Hyperlipidemia Mother   . Cancer Father   . Hypertension Sister    Past Surgical History:  Procedure Laterality Date  . COLONOSCOPY N/A 09/26/2017   Procedure: COLONOSCOPY;  Surgeon: Daneil Dolin, MD;  [facility]: [facility];  Service: Endoscopy;  Laterality: N/A;  12:00-rescheduled to 8/21 @ 10:30am per Almyra Free  . HERNIA REPAIR    . POLYPECTOMY  09/26/2017   Procedure: POLYPECTOMY;  Surgeon: Daneil Dolin, MD;  Location: [facility];  Service: Endoscopy;;     Vanessa Kick, MD 12/26/17 1015

## 2017-12-26 NOTE — Telephone Encounter (Signed)
Pt was informed that the letter is ready for pick-up at the [facility]

## 2018-01-17 ENCOUNTER — Other Ambulatory Visit: Payer: Self-pay

## 2018-01-17 ENCOUNTER — Encounter (INDEPENDENT_AMBULATORY_CARE_PROVIDER_SITE_OTHER): Payer: Self-pay | Admitting: Physician Assistant

## 2018-01-17 ENCOUNTER — Ambulatory Visit (INDEPENDENT_AMBULATORY_CARE_PROVIDER_SITE_OTHER): Payer: Self-pay | Admitting: Physician Assistant

## 2018-01-17 ENCOUNTER — Other Ambulatory Visit (INDEPENDENT_AMBULATORY_CARE_PROVIDER_SITE_OTHER): Payer: Self-pay | Admitting: Physician Assistant

## 2018-01-17 VITALS — BP 139/77 | HR 58 | Temp 97.6°F | Ht 65.0 in | Wt 179.0 lb

## 2018-01-17 DIAGNOSIS — F411 Generalized anxiety disorder: Secondary | ICD-10-CM

## 2018-01-17 DIAGNOSIS — E782 Mixed hyperlipidemia: Secondary | ICD-10-CM

## 2018-01-17 DIAGNOSIS — B029 Zoster without complications: Secondary | ICD-10-CM

## 2018-01-17 MED ORDER — HYDROXYZINE HCL 25 MG PO TABS: 25.0000 mg | ORAL_TABLET | Freq: Every day | ORAL | 1 refills | Status: DC

## 2018-01-17 MED ORDER — ATORVASTATIN CALCIUM 40 MG PO TABS: 40.0000 mg | ORAL_TABLET | Freq: Every day | ORAL | 5 refills | Status: DC

## 2018-01-17 MED ORDER — ESCITALOPRAM OXALATE 20 MG PO TABS: 20.0000 mg | ORAL_TABLET | Freq: Every day | ORAL | 5 refills | Status: DC

## 2018-01-17 MED FILL — ESCITALOPRAM 20 MG TABLET: 20 | 30 days supply | Qty: 30 | Fill #0

## 2018-01-17 MED FILL — hydrOXYzine HCL 25 MG TABS: 25 | 30 days supply | Qty: 30 | Fill #0

## 2018-01-17 MED FILL — ATORVASTATIN 40 MG TABLET: 40 | 30 days supply | Qty: 30 | Fill #0

## 2018-01-17 NOTE — Patient Instructions (Signed)
Colesterol Cholesterol El colesterol es grasa. El organismo necesita una pequea cantidad de colesterol. El colesterol (placa) puede acumularse en los vasos sanguneos (arterias). Esto lo hace ms propenso a sufrir infartos de miocardio o accidentes cerebrovasculares. No puede sentir el nivel de colesterol. La nica forma de saber si el nivel es alto es mediante un anlisis de [place]. Guarde los [employer]. Trabaje con el mdico para Investment banker, corporate en un nivel adecuado. [place] significan los resultados?  El colesterol total es la cantidad de colesterol que hay en la [place].  El LDL es el colesterol [place]. Este tipo es el que puede acumularse. Intente mantener el nivel de LDL bajo.  El HDL es el colesterol bueno. Limpia los vasos sanguneos y elimina el colesterol LDL. Intente mantener el nivel de HDL alto.  Los triglicridos son grasas que el organismo puede Financial controller o quemar como fuente de Teacher, early years/pre. Cules son los niveles buenos de colesterol?  El colesterol total debe estar por debajo de 200.  Lo ms aconsejable para quienes tienen riesgos para la salud es mantener el nivel de LDL por debajo de 100. Lo ms aconsejable para quienes tienen muchos riesgos para la salud es mantener el nivel de LDL por debajo de 70.  Se aconseja mantener un nivel de HDL es por encima de 40. Pero lo mejor es tener un nivel de HDL de 60 o superior.  Los triglicridos por debajo de 150. Cmo puedo bajar el colesterol? Dieta Siga el programa de alimentacin que el mdico le indique.  Elija pescados o carne blanca de pollo y pavo asados u horneados. Intente no comer carne roja, alimentos fritos, salchichas ni embutidos.  Coma gran cantidad de frutas y verduras frescas.  Elija los cereales integrales, los frijoles, las pastas, las papas y los cereales.  Elija aceite de oliva, aceite de maz o aceite de canola. Solo use poca cantidad.  Intente no comer [place], [place], [place] o  aceites de [place].  Intente no comer alimentos que contengan grasas trans.  Elija alimentos lcteos descremados o sin grasa. ? Beba leche descremada o sin grasa. ? Coma yogur y quesos descremados o sin grasa. ? Intente no consumir leche entera o crema. ? Intente no comer helados, yemas de huevo ni quesos enteros.  Los postres sanos incluyen la torta ngel, los bocadillos de [place], las Administrator con forma de [place], los caramelos duros, los helados de agua y el yogur descremado o sin grasa. Intente no comer masas, tortas, pasteles y galletas.  La prctica de actividad fsica. Siga el programa de actividad fsica que le indique el mdico.  Sea ms [place]. Intente hacer los trabajos de [place], salir a Writer y usar las escaleras.  Consulte al mdico cmo puede aumentar su actividad.  Medicamentos  Delphi de venta libre y los recetados solamente como se lo haya indicado el mdico.  Esta informacin no tiene Marine scientist el consejo del mdico. Asegrese de hacerle al mdico cualquier pregunta que tenga. Document Released: 02/25/2010 Document Revised: 04/24/2016 Document Reviewed: 08/05/2015 Elsevier Interactive Patient Education  [employer].

## 2018-01-17 NOTE — Progress Notes (Signed)
Subjective:  Patient ID: Marcus Solis, male    DOB: 1960-04-02  Age: 57 y.o. MRN: [id number]  CC: shingles  HPI Marcus Arango-Ramirezis a 57 y.o.malewith a medical history ofanxiety,left inguinal hernia, lumbago, open angle glaucoma,HLD,and allergic conjunctivitis of both eyes presents on hospital f/u for painful skin rash. Went to ED three weeks ago. Diagnosed with shingles and prescribed Valtrex, Capsicum cream, and Voltaren. Says his shingles outbreak is resolving and does not have pain.     Has run out of escitalopram and requests refills. Feels more nervous and has highly disrupted sleep since having run out of escitalopram a month ago. Sleep is much more restful and prolonged when taking escitalopram. Does not endorse any other symptoms or complaints.     Outpatient Medications Prior to Visit  Medication Sig Dispense Refill  . atorvastatin (LIPITOR) 40 MG tablet Take 1 tablet (40 mg total) by mouth daily. (Patient not taking: Reported on 01/17/2018) 30 tablet 11  . capsicum (ZOSTRIX) 0.075 % topical cream Apply 1 application topically 2 (two) times daily. For pain. (Patient not taking: Reported on 01/17/2018) 28.3 g 0  . diclofenac (VOLTAREN) 75 MG EC tablet Take 1 tablet (75 mg total) by mouth 2 (two) times daily. (Patient not taking: Reported on 01/17/2018) 14 tablet 0  . escitalopram (LEXAPRO) 20 MG tablet Take 1 tablet (20 mg total) by mouth daily. (Patient not taking: Reported on 01/17/2018) 30 tablet 2  . hydrOXYzine (ATARAX/VISTARIL) 25 MG tablet Take 1 tablet (25 mg total) by mouth at bedtime. (Patient not taking: Reported on 01/17/2018) 30 tablet 1  . valACYclovir (VALTREX) 1000 MG tablet Take 1 tablet (1,000 mg total) by mouth 3 (three) times daily. (Patient not taking: Reported on 01/17/2018) 21 tablet 0   No facility-administered medications prior to visit.      ROS Review of Systems  Constitutional: Negative for chills, fever and malaise/fatigue.   Eyes: Negative for blurred vision.  Respiratory: Negative for shortness of breath.   Cardiovascular: Negative for chest pain and palpitations.  Gastrointestinal: Negative for abdominal pain and nausea.  Genitourinary: Negative for dysuria and hematuria.  Musculoskeletal: Negative for joint pain and myalgias.  Skin: Negative for rash.  Neurological: Negative for tingling and headaches.  Psychiatric/Behavioral: Negative for depression. The patient is nervous/anxious and has insomnia.     Objective:  BP 139/77 (BP Location: Left Arm, Patient Position: Sitting, Cuff Size: Normal)   Pulse (!) 58   Temp 97.6 F (36.4 C) (Oral)   Ht 5\' 5"  (1.651 m)   Wt 179 lb (81.2 kg)   SpO2 95%   BMI 29.79 kg/m   BP/Weight 01/17/2018 12/26/2017 7/35/3299  Systolic BP 242 683 419  Diastolic BP 77 75 77  Wt. (Lbs) 179 - 182.8  BMI 29.79 - 30.42      Physical Exam Vitals signs reviewed.  Constitutional:      Comments: Well developed, well nourished, NAD, polite  HENT:     Head: Normocephalic and atraumatic.  Eyes:     General: No scleral icterus. Neck:     Musculoskeletal: Normal range of motion and neck supple.     Thyroid: No thyromegaly.  Cardiovascular:     Rate and Rhythm: Normal rate and regular rhythm.     Heart sounds: Normal heart sounds.  Pulmonary:     Effort: Pulmonary effort is normal.     Breath sounds: Normal breath sounds.  Skin:    Comments: Few lesions in clusters along the right T10 dermatome,  most lesions are dried and crusted but one cluster of papulovesicular lesions still active.   Neurological:     Mental Status: He is alert and oriented to person, place, and time.  Psychiatric:        Behavior: Behavior normal.        Thought Content: Thought content normal.      Assessment & Plan:    1. Mixed hypercholesterolemia and hypertriglyceridemia - atorvastatin (LIPITOR) 40 MG tablet; Take 1 tablet (40 mg total) by mouth daily.  Dispense: 30 tablet; Refill:  5 - Lipid panel; Future - Comprehensive metabolic panel; Future - Comprehensive metabolic panel  2. GAD (generalized anxiety disorder) - escitalopram (LEXAPRO) 20 MG tablet; Take 1 tablet (20 mg total) by mouth daily.  Dispense: 30 tablet; Refill: 5 - hydrOXYzine (ATARAX/VISTARIL) 25 MG tablet; Take 1 tablet (25 mg total) by mouth at bedtime.  Dispense: 30 tablet; Refill: 1  3. Herpes zoster without complication - Resolving. No further treatment necessary. Advised to keep active lesion on lower abdomen covered as it is still contagious.     Meds ordered this encounter  Medications  . atorvastatin (LIPITOR) 40 MG tablet    Sig: Take 1 tablet (40 mg total) by mouth daily.    Dispense:  30 tablet    Refill:  5    Order Specific Question:   Supervising Provider    Answer:   Charlott Rakes [[id number]]  . escitalopram (LEXAPRO) 20 MG tablet    Sig: Take 1 tablet (20 mg total) by mouth daily.    Dispense:  30 tablet    Refill:  5    Order Specific Question:   Supervising Provider    Answer:   Charlott Rakes [[id number]]  . hydrOXYzine (ATARAX/VISTARIL) 25 MG tablet    Sig: Take 1 tablet (25 mg total) by mouth at bedtime.    Dispense:  30 tablet    Refill:  1    Order Specific Question:   Supervising Provider    Answer:   Charlott Rakes [[id number]]    Follow-up: Return in about 3 months (around 04/18/2018) for lipid panel and anxiety f/u.   Clent Demark PA

## 2018-01-18 ENCOUNTER — Encounter (INDEPENDENT_AMBULATORY_CARE_PROVIDER_SITE_OTHER): Payer: Self-pay

## 2018-01-18 ENCOUNTER — Telehealth (INDEPENDENT_AMBULATORY_CARE_PROVIDER_SITE_OTHER): Payer: Self-pay

## 2018-01-18 LAB — COMPREHENSIVE METABOLIC PANEL
ALBUMIN: 4.6 g/dL (ref 3.5–5.5)
ALK PHOS: 95 IU/L (ref 39–117)
ALT: 28 IU/L (ref 0–44)
AST: 23 IU/L (ref 0–40)
Albumin/Globulin Ratio: 2 (ref 1.2–2.2)
BILIRUBIN TOTAL: 0.5 mg/dL (ref 0.0–1.2)
BUN/Creatinine Ratio: 22 — ABNORMAL HIGH (ref 9–20)
BUN: 15 mg/dL (ref 6–24)
CHLORIDE: 99 mmol/L (ref 96–106)
CO2: 23 mmol/L (ref 20–29)
Calcium: 8.9 mg/dL (ref 8.7–10.2)
Creatinine, Ser: 0.68 mg/dL — ABNORMAL LOW (ref 0.76–1.27)
GFR calc Af Amer: 122 mL/min/{1.73_m2} (ref >59)
GFR calc non Af Amer: 105 mL/min/{1.73_m2} (ref >59)
GLUCOSE: 107 mg/dL — AB (ref 65–99)
Globulin, Total: 2.3 g/dL (ref 1.5–4.5)
POTASSIUM: 4.1 mmol/L (ref 3.5–5.2)
SODIUM: 136 mmol/L (ref 134–144)
Total Protein: 6.9 g/dL (ref 6.0–8.5)

## 2018-01-18 NOTE — Telephone Encounter (Signed)
Call placed using pacific interpreter [phone]) patient is aware that labs are normal. Marcus Solis, [name]

## 2018-01-18 NOTE — Telephone Encounter (Signed)
-----   Message from Clent Demark, PA-C sent at 01/18/2018  8:23 AM EST ----- Normal labs.

## 2018-02-21 MED FILL — hydrOXYzine HCL 25 MG TABS: 25 | 30 days supply | Qty: 30 | Fill #1

## 2018-02-21 MED FILL — ATORVASTATIN 40 MG TABLET: 40 | 30 days supply | Qty: 30 | Fill #1

## 2018-02-21 MED FILL — ESCITALOPRAM 20 MG TABLET: 20 | 30 days supply | Qty: 30 | Fill #1

## 2018-04-11 ENCOUNTER — Encounter (INDEPENDENT_AMBULATORY_CARE_PROVIDER_SITE_OTHER): Payer: Self-pay | Admitting: Primary Care

## 2018-04-11 ENCOUNTER — Ambulatory Visit (INDEPENDENT_AMBULATORY_CARE_PROVIDER_SITE_OTHER): Payer: Self-pay | Admitting: Primary Care

## 2018-04-11 ENCOUNTER — Other Ambulatory Visit: Payer: Self-pay

## 2018-04-11 VITALS — BP 139/86 | HR 72 | Temp 97.9°F | Ht 65.0 in | Wt 181.5 lb

## 2018-04-11 DIAGNOSIS — F411 Generalized anxiety disorder: Secondary | ICD-10-CM

## 2018-04-11 DIAGNOSIS — M25512 Pain in left shoulder: Secondary | ICD-10-CM

## 2018-04-11 DIAGNOSIS — M25511 Pain in right shoulder: Secondary | ICD-10-CM

## 2018-04-11 DIAGNOSIS — E782 Mixed hyperlipidemia: Secondary | ICD-10-CM

## 2018-04-11 DIAGNOSIS — F17200 Nicotine dependence, unspecified, uncomplicated: Secondary | ICD-10-CM

## 2018-04-11 MED ORDER — NAPROXEN 500 MG PO TABS: 500.0000 mg | ORAL_TABLET | Freq: Two times a day (BID) | ORAL | 0 refills | Status: DC

## 2018-04-11 MED ORDER — HYDROXYZINE HCL 25 MG PO TABS: 25.0000 mg | ORAL_TABLET | Freq: Every day | ORAL | 3 refills | Status: DC

## 2018-04-11 MED ORDER — ESCITALOPRAM OXALATE 20 MG PO TABS: 20.0000 mg | ORAL_TABLET | Freq: Every day | ORAL | 5 refills | Status: DC

## 2018-04-11 MED ORDER — ATORVASTATIN CALCIUM 40 MG PO TABS: 40.0000 mg | ORAL_TABLET | Freq: Every day | ORAL | 5 refills | Status: DC

## 2018-04-11 NOTE — Progress Notes (Signed)
Acute Office Visit  Subjective:    Patient ID: Marcus Solis, male    DOB: 12-16-1960, 58 y.o.   MRN: [id number]  Chief Complaint  Patient presents with  . Follow-up    anxiety/back pain    HPI Patient is in today for an acute visit with c/o scapular pain previously worked as a Marketing executive.   Pain cervical and bilateral shoulder pain 8/10. Requesting naprosyn. Past medical history ofanxiety,left inguinal hernia, lumbago, open angle glaucoma,HLD,and allergic conjunctivitis of both eyes  and shingles.   Past Medical History:  Diagnosis Date  . Groin pain   . Left inguinal hernia 2015  . Lower abdominal pain   . Lumbago   . Sprain of lumbar region   . Unspecified disorder of male genital organs     Past Surgical History:  Procedure Laterality Date  . COLONOSCOPY N/A 09/26/2017   Procedure: COLONOSCOPY;  Surgeon: Daneil Dolin, MD;  [facility];  Service: Endoscopy;  Laterality: N/A;  12:00-rescheduled to 8/21 @ 10:30am per Almyra Free  . HERNIA REPAIR    . POLYPECTOMY  09/26/2017   Procedure: POLYPECTOMY;  Surgeon: Daneil Dolin, MD;  Location: [facility];  Service: Endoscopy;;    Family History  Problem Relation Age of Onset  . Hyperlipidemia Mother   . Cancer Father   . Hypertension Sister     Social History   Socioeconomic History  . Marital status: Married    Spouse name: Not on file  . Number of children: Not on file  . Years of education: Not on file  . Highest education level: Not on file  Occupational History  . Not on file  Social Needs  . Financial resource strain: Not on file  . Food insecurity:    Worry: Not on file    Inability: Not on file  . Transportation needs:    Medical: Not on file    Non-medical: Not on file  Tobacco Use  . Smoking status: Former Research scientist (life sciences)  . Smokeless tobacco: Never Used  . Tobacco comment: quit 10 years ago   Substance and Sexual Activity  . Alcohol use: No  . Drug use: No   . Sexual activity: Not on file  Lifestyle  . Physical activity:    Days per week: Not on file    Minutes per session: Not on file  . Stress: Not on file  Relationships  . Social connections:    Talks on phone: Not on file    Gets together: Not on file    Attends religious service: Not on file    Active member of club or organization: Not on file    Attends meetings of clubs or organizations: Not on file    Relationship status: Not on file  . Intimate partner violence:    Fear of current or ex partner: Not on file    Emotionally abused: Not on file    Physically abused: Not on file    Forced sexual activity: Not on file  Other Topics Concern  . Not on file  Social History Narrative  . Not on file    Outpatient Medications Prior to Visit  Medication Sig Dispense Refill  . atorvastatin (LIPITOR) 40 MG tablet Take 1 tablet (40 mg total) by mouth daily. 30 tablet 5  . escitalopram (LEXAPRO) 20 MG tablet Take 1 tablet (20 mg total) by mouth daily. 30 tablet 5  . hydrOXYzine (ATARAX/VISTARIL) 25 MG tablet Take 1 tablet (  25 mg total) by mouth at bedtime. 30 tablet 1   No facility-administered medications prior to visit.     No Known Allergies  Review of Systems  Constitutional: Negative.   HENT: Negative.   Eyes: Negative.   Respiratory: Negative.   Cardiovascular: Negative.   Gastrointestinal: Negative.   Genitourinary: Negative.   Musculoskeletal: Positive for back pain, joint pain and neck pain.  Skin: Negative.   Neurological: Negative.   Psychiatric/Behavioral: Negative.        Objective:    Physical Exam  Constitutional: He is oriented to person, place, and time. He appears well-developed and well-nourished.  HENT:  Head: Normocephalic.  Eyes: EOM are normal.  Neck: Normal range of motion. Neck supple.  Cardiovascular: Normal rate and regular rhythm.  Pulmonary/Chest: Effort normal and breath sounds normal.  Abdominal: Soft. Bowel sounds are normal. He  exhibits distension.  Musculoskeletal: Normal range of motion.  Neurological: He is alert and oriented to person, place, and time.  Skin: Skin is warm and dry.  Psychiatric: He has a normal mood and affect.    BP 139/86 (BP Location: Right Arm, Patient Position: Sitting, Cuff Size: Normal)   Pulse 72   Temp 97.9 F (36.6 C) (Oral)   Ht 5\' 5"  (1.651 m)   Wt 181 lb 8 oz (82.3 kg)   SpO2 92%   BMI 30.20 kg/m  Wt Readings from Last 3 Encounters:  04/11/18 181 lb 8 oz (82.3 kg)  01/17/18 179 lb (81.2 kg)  10/29/17 182 lb 12.8 oz (82.9 kg)    There are no preventive care reminders to display for this patient.  There are no preventive care reminders to display for this patient.   Lab Results  Component Value Date   TSH 3.400 03/15/2017   Lab Results  Component Value Date   WBC 7.0 03/15/2017   HGB 16.4 03/15/2017   HCT 47.5 03/15/2017   MCV 88 03/15/2017   PLT 296 03/15/2017   Lab Results  Component Value Date   NA 136 01/17/2018   K 4.1 01/17/2018   CO2 23 01/17/2018   GLUCOSE 107 (H) 01/17/2018   BUN 15 01/17/2018   CREATININE 0.68 (L) 01/17/2018   BILITOT 0.5 01/17/2018   ALKPHOS 95 01/17/2018   AST 23 01/17/2018   ALT 28 01/17/2018   PROT 6.9 01/17/2018   ALBUMIN 4.6 01/17/2018   CALCIUM 8.9 01/17/2018   Lab Results  Component Value Date   CHOL 200 (H) 10/02/2017   Lab Results  Component Value Date   HDL 41 10/02/2017   Lab Results  Component Value Date   LDLCALC 110 (H) 10/02/2017   Lab Results  Component Value Date   TRIG 247 (H) 10/02/2017   Lab Results  Component Value Date   CHOLHDL 4.9 10/02/2017   No results found for: HGBA1C     Assessment & Plan:  Marcus Solis was seen today for follow-up.  Diagnoses and all orders for this visit:  TOBACCO DEPENDENCE Each visit discuss cessation  Mixed hypercholesterolemia and hypertriglyceridemia -     Lipid panel; Future rtn in AM for FLP -     atorvastatin (LIPITOR) 40 MG tablet; Take 1  tablet (40 mg total) by mouth daily.  Pain of both shoulder joints -     Ambulatory referral to Orthopedic Surgery  GAD (generalized anxiety disorder) -     escitalopram (LEXAPRO) 20 MG tablet; Take 1 tablet (20 mg total) by mouth daily. -  hydrOXYzine (ATARAX/VISTARIL) 25 MG tablet; Take 1 tablet (25 mg total) by mouth at bedtime. Insomnia   Other orders -     naproxen (NAPROSYN) 500 MG tablet; Take 1 tablet (500 mg total) by mouth 2 (two) times daily with a meal.   Problem List Items Addressed This Visit    TOBACCO DEPENDENCE - Primary    Other Visit Diagnoses    Mixed hypercholesterolemia and hypertriglyceridemia       Relevant Medications   atorvastatin (LIPITOR) 40 MG tablet   Other Relevant Orders   Lipid panel   Pain of both shoulder joints       Relevant Orders   Ambulatory referral to Orthopedic Surgery   GAD (generalized anxiety disorder)       Relevant Medications   escitalopram (LEXAPRO) 20 MG tablet   hydrOXYzine (ATARAX/VISTARIL) 25 MG tablet       Meds ordered this encounter  Medications  . atorvastatin (LIPITOR) 40 MG tablet    Sig: Take 1 tablet (40 mg total) by mouth daily.    Dispense:  30 tablet    Refill:  5  . escitalopram (LEXAPRO) 20 MG tablet    Sig: Take 1 tablet (20 mg total) by mouth daily.    Dispense:  30 tablet    Refill:  5  . hydrOXYzine (ATARAX/VISTARIL) 25 MG tablet    Sig: Take 1 tablet (25 mg total) by mouth at bedtime.    Dispense:  30 tablet    Refill:  3  . naproxen (NAPROSYN) 500 MG tablet    Sig: Take 1 tablet (500 mg total) by mouth 2 (two) times daily with a meal.    Dispense:  30 tablet    Refill:  0     Kerin Perna, NP

## 2018-04-11 NOTE — Patient Instructions (Signed)
Place neck pain instructions here. Dolor en el hombro Shoulder Pain Muchas cosas pueden provocar dolor en el hombro, por ejemplo:  Una lesin.  Un movimiento del hombro que se repite [place] vez de la misma manera (uso excesivo).  Dolor en las articulaciones (artritis). El dolor puede deberse a lo siguiente:  Hinchazn e irritacin (inflamacin) de alguna parte del hombro.  Una lesin en la articulacin del hombro.  Una lesin en: ? Los tejidos que conectan el msculo al hueso (tendones). ? Los tejidos que [employer] s (ligamentos). ? Los [employer]. Siga estas indicaciones en su casa: Controle los cambios en sus sntomas. Informe a su mdico acerca de los cambios. Estas indicaciones pueden ayudarlo con Conservation officer, historic buildings. Si tiene un cabestrillo:  Use el cabestrillo como se lo haya indicado el mdico. Quteselo solamente como se lo haya indicado el mdico.  Afloje el cabestrillo si los dedos: ? Hormiguean. ? Se adormecen. ? Se tornan fros y de [employer].  Mantenga el cabestrillo limpio.  Si el cabestrillo no es impermeable: ? No deje que se moje. ? Qutese el cabestrillo para ducharse o baarse. Control del dolor, la rigidez y la hinchazn   Si se lo indican, aplique hielo sobre la zona dolorida: ? Field seismologist hielo en una bolsa plstica. ? Coloque una [employer] piel y Therapist, nutritional. ? Coloque el hielo durante 22minutos, 2 a 3veces por da. Deje de aplicarse hielo si no ayuda a Best boy.  Apriete una pelota blanda o una almohadilla de goma tanto como sea posible. Esto impide que el hombro se hinche. Tambin ayuda a Veterinary surgeon. Indicaciones generales  [employer]phi de venta libre y los recetados solamente como se lo haya indicado el mdico.  Consulting civil engineer a todas las visitas de seguimiento como se lo haya indicado el mdico. Esto es importante. Comunquese con un mdico si:  El Holiday representative.  Los medicamentos no Futures trader.  Siente un dolor nuevo en el brazo, la mano o los dedos. Solicite ayuda inmediatamente si:  El brazo, la mano o los dedos: ? Hormiguean. ? Estn adormecidos. ? Estn hinchados. ? Estn doloridos. ? Se tornan de color blanco o azul. Resumen  Varias pueden ser las causas del dolor en el hombro. Estas incluyen lesiones, mover el hombro en el mismo sentido una y Elmon Kirschner, y Social research officer, government en las articulaciones.  Controle los cambios en sus sntomas. Informe a su mdico acerca de los cambios.  Esta afeccin se puede tratar con un cabestrillo, hielo y un medicamento para Conservation officer, historic buildings.  Comunquese con su mdico si el dolor empeora o tiene un dolor nuevo. Solicite ayuda de inmediato si el brazo, la mano o los dedos se le adormecen o si siente hormigueo, se le hinchan o le duelen.  Concurra a todas las visitas de seguimiento como se lo haya indicado el mdico. Esto es importante. Esta informacin no tiene Marine scientist el consejo del mdico. Asegrese de hacerle al mdico cualquier pregunta que tenga. Document Released: 04/21/2008 Document Revised: 09/26/2017 Document Reviewed: 09/26/2017 Elsevier Interactive Patient Education  2019 [employer].

## 2018-04-12 ENCOUNTER — Ambulatory Visit (INDEPENDENT_AMBULATORY_CARE_PROVIDER_SITE_OTHER): Payer: Self-pay

## 2018-04-12 ENCOUNTER — Encounter (INDEPENDENT_AMBULATORY_CARE_PROVIDER_SITE_OTHER): Payer: Self-pay | Admitting: Family Medicine

## 2018-04-12 ENCOUNTER — Ambulatory Visit (INDEPENDENT_AMBULATORY_CARE_PROVIDER_SITE_OTHER): Payer: Self-pay | Admitting: Family Medicine

## 2018-04-12 VITALS — Ht 66.0 in | Wt 181.0 lb

## 2018-04-12 DIAGNOSIS — M25512 Pain in left shoulder: Secondary | ICD-10-CM

## 2018-04-12 DIAGNOSIS — M25511 Pain in right shoulder: Secondary | ICD-10-CM

## 2018-04-12 DIAGNOSIS — G8929 Other chronic pain: Secondary | ICD-10-CM

## 2018-04-12 DIAGNOSIS — M542 Cervicalgia: Secondary | ICD-10-CM

## 2018-04-12 MED ORDER — TIZANIDINE HCL 2 MG PO TABS: 2.0000 mg | ORAL_TABLET | Freq: Every evening | ORAL | 1 refills | Status: DC | PRN

## 2018-04-12 MED FILL — NAPROXEN 500 MG TABLET: 500 | 15 days supply | Qty: 30 | Fill #0

## 2018-04-12 MED FILL — ESCITALOPRAM 20 MG TABLET: 20 | 30 days supply | Qty: 30 | Fill #0

## 2018-04-12 MED FILL — tiZANidine HCL 2 MG TABS: 2 | 30 days supply | Qty: 60 | Fill #0

## 2018-04-12 MED FILL — hydrOXYzine HCL 25 MG TABS: 25 | 30 days supply | Qty: 30 | Fill #0

## 2018-04-12 MED FILL — ?ATORVASTATIN 40MG TABLET: 40 | 30 days supply | Qty: 30 | Fill #0

## 2018-04-12 NOTE — Progress Notes (Signed)
Office Visit Note   Patient: Marcus Solis           Date of Birth: Sep 13, 1960           MRN: [id number] Visit Date: 04/12/2018 Requested by: Kerin Perna, NP [address] [place], [place] [address] PCP: Kerin Perna, NP  Subjective: Chief Complaint  Patient presents with  . Right Shoulder - Pain    NKI, onset 5-6 months ago, not doing anything for treatment, was seen by Dr. And was given pain meds and referred him here. Complains of pain in the neck with rotating head. Pain down between and into the shoulder blades  . Left Shoulder - Pain    HPI: He is a 58 year old right-hand-dominant male with neck and bilateral shoulder pain.  He has an interpreter with him today.  Symptoms started about 6 months ago, no definite injury.  Gradual onset of pain in both shoulders.  Pain is on top of the shoulders, with radiation into the shoulder blade areas and up to the neck.  He has not noticed much in the way of radicular symptoms, no numbness or weakness in his arms.  Recently he was given naproxen and it seems to help.  He recalls an injury about 30 years ago where a tree fell on his left shoulder but he recovered without any complication, did not require any surgery.  He was pain-free until recently.  He is on Lipitor for hyperlipidemia.  He was on this prior to the onset of his pain.  He does not think his pain is related to that.               ROS: He takes medication for anxiety.  Other systems were reviewed and are negative.  Objective: Vital Signs: Ht 5\' 6"  (1.676 m)   Wt 181 lb (82.1 kg)   BMI 29.21 kg/m   Physical Exam:  Neck: Full active range of motion, Spurling's test is negative.  He does have some paraspinous trigger points on both sides.  He has trigger points in the trapezius bellies and the rhomboid areas bilaterally which seem to reproduce a lot of his pain.  Upper extremity strength and reflexes are normal.  Shoulders have bilateral mild  tenderness at the [facility] joints and mild to moderate tenderness over the long head biceps tendons.  Speeds test is negative, rotator cuff strength is 5/5 throughout.  Imaging: X-ray cervical spine: Overall well-preserved disc spaces.  No significant facet arthropathy.  There is some ossification at the anterior inferior aspect of C7 vertebral body.  X-ray Shoulders: Bilateral mild to moderate AC joint arthropathy.  Glenohumeral joints look good.  No soft tissue calcifications.   Assessment & Plan: 1.  Neck and bilateral shoulder pain, probably myofascial pain. -Continue with naproxen, try Zanaflex at bedtime.  Physical therapy for myofascial release techniques.  Over-the-counter vitamin D3, magnesium, and coenzyme Q 10. -Consider additional imaging if symptoms persist.     Procedures: No procedures performed  No notes on file     PMFS History: Patient Active Problem List   Diagnosis Date Noted  . Chronic pain syndrome 03/18/2015  . TOBACCO DEPENDENCE 04/05/2006  . HEARING LOSS NOS OR DEAFNESS 04/05/2006   Past Medical History:  Diagnosis Date  . Groin pain   . Left inguinal hernia 2015  . Lower abdominal pain   . Lumbago   . Sprain of lumbar region   . Unspecified disorder of male genital organs  Family History  Problem Relation Age of Onset  . Hyperlipidemia Mother   . Cancer Father   . Hypertension Sister     Past Surgical History:  Procedure Laterality Date  . COLONOSCOPY N/A 09/26/2017   Procedure: COLONOSCOPY;  Surgeon: Daneil Dolin, MD;  [facility];  Service: Endoscopy;  Laterality: N/A;  12:00-rescheduled to 8/21 @ 10:30am per Almyra Free  . HERNIA REPAIR    . POLYPECTOMY  09/26/2017   Procedure: POLYPECTOMY;  Surgeon: Daneil Dolin, MD;  [facility];  Service: Endoscopy;;   Social History   Occupational History  . Not on file  Tobacco Use  . Smoking status: Former Research scientist (life sciences)  . Smokeless tobacco: Never Used  . Tobacco comment: quit  10 years ago   Substance and Sexual Activity  . Alcohol use: No  . Drug use: No  . Sexual activity: Not on file

## 2018-04-12 NOTE — Patient Instructions (Signed)
    Vitamin D3:  Take 5,000 IU daily  Magnesium:  400 mg daily  Coenzyme Q10:  Take 100 mg daily

## 2018-04-17 ENCOUNTER — Encounter: Payer: Self-pay | Admitting: Physical Therapy

## 2018-04-17 ENCOUNTER — Other Ambulatory Visit: Payer: Self-pay

## 2018-04-17 ENCOUNTER — Ambulatory Visit: Payer: Self-pay | Attending: Family Medicine | Admitting: Physical Therapy

## 2018-04-17 DIAGNOSIS — M542 Cervicalgia: Secondary | ICD-10-CM | POA: Insufficient documentation

## 2018-04-17 DIAGNOSIS — M25511 Pain in right shoulder: Secondary | ICD-10-CM | POA: Insufficient documentation

## 2018-04-17 DIAGNOSIS — G8929 Other chronic pain: Secondary | ICD-10-CM | POA: Insufficient documentation

## 2018-04-18 NOTE — Therapy (Addendum)
[place], [place], [address] Phone: [phone]   Fax:  [phone]  Physical Therapy Evaluation/Discharge   Patient Details  Name: Marcus Solis MRN: [id number] Date of Birth: Mar 05, 1960 Referring Provider (PT): Dr Eunice Blase   Encounter Date: 04/17/2018  PT End of Session - 04/17/18 1514    Visit Number  1    Number of Visits  12    Date for PT Re-Evaluation  05/29/18    Authorization Type  CAFA 100%     PT Start Time  1500    PT Stop Time  1542    PT Time Calculation (min)  42 min    Activity Tolerance  Patient tolerated treatment well    Behavior During Therapy  [facility] for tasks assessed/performed       Past Medical History:  Diagnosis Date  . Groin pain   . Left inguinal hernia 2015  . Lower abdominal pain   . Lumbago   . Sprain of lumbar region   . Unspecified disorder of male genital organs     Past Surgical History:  Procedure Laterality Date  . COLONOSCOPY N/A 09/26/2017   Procedure: COLONOSCOPY;  Surgeon: Daneil Dolin, MD;  [facility];  Service: Endoscopy;  Laterality: N/A;  12:00-rescheduled to 8/21 @ 10:30am per Almyra Free  . HERNIA REPAIR    . POLYPECTOMY  09/26/2017   Procedure: POLYPECTOMY;  Surgeon: Daneil Dolin, MD;  Location: [facility];  Service: Endoscopy;;    There were no vitals filed for this visit.   Subjective Assessment - 04/17/18 1503    Subjective  2-3 months ago the patient began having neck pain that radiates down into his shoulder bldes. he feels tightness in his nechk and into his traps. He has pain in the right shoulder.     Pertinent History  Low Back Pain    Limitations  Other (comment)    Diagnostic tests  cervical x-ray: mild ossification at C7; Shoulder x-ray: mild ac joint arthritis    Currently in Pain?  Yes    Pain Score  4     Pain Location  Neck    Pain Orientation  Right    Pain Descriptors / Indicators  Aching     Pain Type  Acute pain    Pain Onset  More than a month ago    Pain Frequency  Constant    Aggravating Factors   just comes on;     Pain Relieving Factors  pain pills     Effect of Pain on Daily Activities  stiffness         [facility] PT Assessment - 04/18/18 0001      Assessment   Medical Diagnosis  Cerivcalgia and right anterior shoulder pain     Referring Provider (PT)  Dr Legrand Como Hilts    Onset Date/Surgical Date  --   2-3 months    Hand Dominance  Right    Next MD Visit  2-3 months    Prior Therapy  n      Precautions   Precautions  None      Restrictions   Weight Bearing Restrictions  No      Balance Screen   Has the patient fallen in the past 6 months  No    Has the patient had a decrease in activity level because of a fear of falling?   No    Is the patient reluctant to leave their  home because of a fear of falling?   No      Home Environment   Additional Comments  Nothing significant       Prior Function   Level of Independence  Independent    Vocation  Unemployed    Leisure  nothign       Cognition   Overall Cognitive Status  Within Functional Limits for tasks assessed    Attention  Focused      Observation/Other Assessments   Focus on Therapeutic Outcomes (FOTO)   not set up       Sensation   Light Touch  Appears Intact    Additional Comments  Radiating pain into his shoulder to his elbow. No radiating pain       Coordination   Gross Motor Movements are Fluid and Coordinated  Yes    Fine Motor Movements are Fluid and Coordinated  Yes      Posture/Postural Control   Posture Comments  rounded shoulders       AROM   AROM Assessment Site  Shoulder    Right/Left Shoulder  Right    Right Shoulder Flexion  140 Degrees   with pain    Right Shoulder ABduction  106 Degrees   with pain    Right Shoulder Internal Rotation  --   to belt with pain    Right Shoulder External Rotation  --   to back of the head    Cervical - Right Side Bend  20    Cervical -  Left Side Bend  20    Cervical - Right Rotation  50    Cervical - Left Rotation  30      Strength   Right Shoulder Flexion  4+/5    Right Shoulder ABduction  4+/5    Right Shoulder Internal Rotation  5/5    Right Shoulder External Rotation  4+/5      Palpation   Palpation comment  Spasming of upper traps and into peri-scapular area; tendenress in the anterior shoulder       Special Tests   Other special tests  Hawikins (+) Speeds (+)                Objective measurements completed on examination: See above findings.      [facility] Adult PT Treatment/Exercise - 04/18/18 0001      Neck Exercises: Seated   Other Seated Exercise  scap retraction x10       Neck Exercises: Stretches   Upper Trapezius Stretch  2 reps;20 seconds;Right;Left    Levator Stretch  2 reps;20 seconds;Right;Left             PT Education - 04/17/18 1514    Education Details  HEP; symptom mangement     Person(s) Educated  Patient    Methods  Explanation;Demonstration;Tactile cues;Verbal cues    Comprehension  Verbalized understanding;Returned demonstration;Verbal cues required;Tactile cues required       PT Short Term Goals - 04/17/18 1518      PT SHORT TERM GOAL #1   Title  Patient will increase bilateral cervical rotation by 205 degrees    Time  3    Period  Weeks    Status  New    Target Date  05/08/18      PT SHORT TERM GOAL #2   Title  Patient will increase cervical flexion and extension by 10 degrees     Time  3    Period  Weeks  Status  New    Target Date  05/09/18      PT SHORT TERM GOAL #3   Title  Patient will demonstrate full active shoulder flexion on the right     Time  3    Period  Weeks    Status  New    Target Date  05/09/18        PT Long Term Goals - 04/18/18 1259      PT LONG TERM GOAL #1   Title  Patient will reach overhead without pain with right shoulder with 2 pound weight in order to perfrom ADL's    Time  6    Period  Weeks    Status  New     Target Date  05/30/18      PT LONG TERM GOAL #2   Title  Patient will sit with good posture for 1 session without cuing     Time  6    Period  Weeks    Status  New    Target Date  05/30/18      PT LONG TERM GOAL #3   Title  Patient will increase bilateral cervical motion to 65 degrees to increase safety in the community    Time  6    Period  Weeks    Status  New    Target Date  05/30/18             Plan - 04/17/18 1515    Clinical Impression Statement  Patient is a 58 year old male with cervical pain that raidates into his right arm and tightness in his neck and shoulders. Signs and symptoms are consident with bicpes/RTC tendinitis and also postural dysfuction which is likley causing neck musculature tightness. He hads significant limitations in cervical motion. He also has pain with active motion of his shoulder. He has tenderness to palpation in the anterior shoulder. He also has a positive empty can and a positive Hawkins test. He would benefit from skilled therapy to improve posture and decreased soft tissue restrictions.     Personal Factors and Comorbidities  Comorbidity 1    Comorbidities  Low back pain     Examination-Participation Restrictions  Community Activity;Yard Work    [employer]  Stable/Uncomplicated   Mild pain and tightness    Clinical Decision Making  Low    Rehab Potential  Good    PT Frequency  2x / week    PT Duration  6 weeks    PT Treatment/Interventions  ADLs/Self Care Home Management;Spinal Manipulations;Cryotherapy;Electrical Stimulation;Iontophoresis 93m/ml Dexamethasone;Ultrasound;Traction;Gait training;Functional mobility training;Therapeutic activities;Therapeutic exercise;Neuromuscular re-education;Patient/family education;Manual techniques;Passive range of motion;Dry needling;Taping    PT Next Visit Plan  beging manual therapy to cervical spine; add rt strengthening exercises; add scap retraction and extension; dry  needling PRN; modalities PRN to bicpes goroove. Really needs postural correction; really wants massage.     PT Home Exercise Plan  scap rtraction; upper trap stretch; trigger point release to spine     Consulted and Agree with Plan of Care  Patient       Patient will benefit from skilled therapeutic intervention in order to improve the following deficits and impairments:  Pain, Postural dysfunction, Increased muscle spasms, Decreased endurance, Decreased range of motion, Decreased strength, Decreased activity tolerance, Impaired UE functional use  Visit Diagnosis: Cervicalgia  Chronic right shoulder pain   PHYSICAL THERAPY DISCHARGE SUMMARY  Visits from Start of Care: 1  Current functional level related to goals /  functional outcomes: Unknown patient did not return    Remaining deficits: Unknown    Education / Equipment: Unknown   Plan: Patient agrees to discharge.  Patient goals were not met. Patient is being discharged due to not returning since the last visit.  ?????      Problem List Patient Active Problem List   Diagnosis Date Noted  . Chronic pain syndrome 03/18/2015  . TOBACCO DEPENDENCE 04/05/2006  . HEARING LOSS NOS OR DEAFNESS 04/05/2006    Carney Living PT DPT  04/18/2018, 1:02 PM  [facility] [address] [place], [place], [address] Phone: [phone]   Fax:  [phone]  Name: Marcus Solis MRN: [id number] Date of Birth: Jun 23, 1960

## 2018-05-01 ENCOUNTER — Ambulatory Visit: Payer: Self-pay | Admitting: Physical Therapy

## 2018-05-06 ENCOUNTER — Ambulatory Visit: Payer: Self-pay | Admitting: Physical Therapy

## 2018-05-08 ENCOUNTER — Ambulatory Visit: Payer: Self-pay | Admitting: Physical Therapy

## 2018-05-13 ENCOUNTER — Ambulatory Visit: Payer: Self-pay | Admitting: Physical Therapy

## 2018-05-15 ENCOUNTER — Ambulatory Visit: Payer: Medicaid Other | Admitting: Physical Therapy

## 2018-05-20 ENCOUNTER — Encounter: Payer: Self-pay | Admitting: Physical Therapy

## 2018-05-22 ENCOUNTER — Encounter: Payer: Self-pay | Admitting: Physical Therapy

## 2018-05-22 MED FILL — ?ATORVASTATIN 40MG TABLET: 40 | 30 days supply | Qty: 30 | Fill #1

## 2018-05-22 MED FILL — hydrOXYzine HCL 25 MG TABS: 25 | 30 days supply | Qty: 30 | Fill #1

## 2018-05-22 MED FILL — ESCITALOPRAM 20 MG TABLET: 20 | 30 days supply | Qty: 30 | Fill #1

## 2018-06-03 MED FILL — ?tiZANidine HCL 2 MG CAPS: 2 | 30 days supply | Qty: 60 | Fill #0

## 2018-06-17 MED FILL — ?ATORVASTATIN 40MG TABLET: 40 | 30 days supply | Qty: 30 | Fill #2

## 2018-06-17 MED FILL — hydrOXYzine HCL 25 MG TABS: 25 | 30 days supply | Qty: 30 | Fill #2

## 2018-06-17 MED FILL — ESCITALOPRAM 20 MG TABLET: 20 | 30 days supply | Qty: 30 | Fill #2

## 2018-07-15 ENCOUNTER — Other Ambulatory Visit (INDEPENDENT_AMBULATORY_CARE_PROVIDER_SITE_OTHER): Payer: Medicaid Other

## 2018-07-15 ENCOUNTER — Other Ambulatory Visit: Payer: Self-pay

## 2018-07-15 ENCOUNTER — Ambulatory Visit (INDEPENDENT_AMBULATORY_CARE_PROVIDER_SITE_OTHER): Payer: Self-pay | Admitting: Primary Care

## 2018-07-15 DIAGNOSIS — E782 Mixed hyperlipidemia: Secondary | ICD-10-CM

## 2018-07-16 ENCOUNTER — Other Ambulatory Visit: Payer: Self-pay | Admitting: Primary Care

## 2018-07-16 LAB — LIPID PANEL
Chol/HDL Ratio: 6.4 ratio — ABNORMAL HIGH (ref 0.0–5.0)
Cholesterol, Total: 199 mg/dL (ref 100–199)
HDL: 31 mg/dL — ABNORMAL LOW (ref >39)
LDL Calculated: 108 mg/dL — ABNORMAL HIGH (ref 0–99)
Triglycerides: 300 mg/dL — ABNORMAL HIGH (ref 0–149)
VLDL Cholesterol Cal: 60 mg/dL — ABNORMAL HIGH (ref 5–40)

## 2018-07-16 MED ORDER — EZETIMIBE 10 MG PO TABS: 10.0000 mg | ORAL_TABLET | Freq: Every day | ORAL | 3 refills | Status: DC

## 2018-07-16 MED FILL — ?EZETIMIBE 10 MG TABS: 10 | 30 days supply | Qty: 30 | Fill #0

## 2018-07-17 ENCOUNTER — Ambulatory Visit (INDEPENDENT_AMBULATORY_CARE_PROVIDER_SITE_OTHER): Payer: Medicaid Other | Admitting: Primary Care

## 2018-07-23 ENCOUNTER — Telehealth: Payer: Self-pay

## 2018-07-23 NOTE — Telephone Encounter (Signed)
Call placed using pacific interpreter [phone]). Patient did not answer. Left detailed message informing patient that cholesterol is very elevated. Zetia has been added to medications to take in conjunction with atorvastatin. Please return call to [facility] at [phone]. Nat Christen, CMA

## 2018-07-23 NOTE — Telephone Encounter (Signed)
-----   Message from Kerin Perna, NP sent at 07/16/2018 12:11 PM EDT ----- Added Zetia with his atorvastatin elevated TG,VLDL, LDL and ratio 6.4  He has a 37 cardiovascular event of stroke or heart attack in the next 10 years

## 2018-07-29 ENCOUNTER — Telehealth (INDEPENDENT_AMBULATORY_CARE_PROVIDER_SITE_OTHER): Payer: Self-pay

## 2018-07-29 NOTE — Telephone Encounter (Signed)
-----   Message from Kerin Perna, NP sent at 07/16/2018 12:11 PM EDT ----- Added Zetia with his atorvastatin elevated TG,VLDL, LDL and ratio 6.4  He has a 37 cardiovascular event of stroke or heart attack in the next 10 years

## 2018-07-29 NOTE — Telephone Encounter (Signed)
Call placed using pacific interpreter [phone]) left detailed voicemail informing patient that his cholesterol is very elevated. zetia added to medications along with atorvastatin to help reduce cholesterol. Patient told on voicemail to take both. Patient advised of increased risk for stroke and heart attack due to elevated cholesterol. Return call to [facility] at [phone] with any questions or concerns. Nat Christen, CMA

## 2018-09-20 MED FILL — hydrOXYzine HCL 25 MG TABS: 25 | 30 days supply | Qty: 30 | Fill #3

## 2018-09-20 MED FILL — ESCITALOPRAM 20 MG TABLET: 20 | 30 days supply | Qty: 30 | Fill #3

## 2018-09-20 MED FILL — ?ATORVASTATIN 40MG TABLET: 40 | 30 days supply | Qty: 30 | Fill #3

## 2018-09-30 ENCOUNTER — Other Ambulatory Visit: Payer: Self-pay

## 2018-09-30 ENCOUNTER — Ambulatory Visit: Payer: Self-pay | Attending: Family Medicine

## 2018-10-02 ENCOUNTER — Encounter (INDEPENDENT_AMBULATORY_CARE_PROVIDER_SITE_OTHER): Payer: Self-pay | Admitting: Primary Care

## 2018-10-02 ENCOUNTER — Ambulatory Visit (INDEPENDENT_AMBULATORY_CARE_PROVIDER_SITE_OTHER): Payer: Medicaid Other | Admitting: Primary Care

## 2018-10-02 ENCOUNTER — Other Ambulatory Visit: Payer: Self-pay

## 2018-10-02 DIAGNOSIS — E782 Mixed hyperlipidemia: Secondary | ICD-10-CM

## 2018-10-02 DIAGNOSIS — R1031 Right lower quadrant pain: Secondary | ICD-10-CM

## 2018-10-02 DIAGNOSIS — F17200 Nicotine dependence, unspecified, uncomplicated: Secondary | ICD-10-CM

## 2018-10-02 DIAGNOSIS — K219 Gastro-esophageal reflux disease without esophagitis: Secondary | ICD-10-CM

## 2018-10-02 DIAGNOSIS — F411 Generalized anxiety disorder: Secondary | ICD-10-CM

## 2018-10-02 MED ORDER — ESCITALOPRAM OXALATE 20 MG PO TABS: 20.0000 mg | ORAL_TABLET | Freq: Every day | ORAL | 5 refills | Status: DC

## 2018-10-02 MED ORDER — OMEPRAZOLE 20 MG PO CPDR: 20.0000 mg | DELAYED_RELEASE_CAPSULE | Freq: Every day | ORAL | 3 refills | Status: DC

## 2018-10-02 MED ORDER — HYDROXYZINE HCL 25 MG PO TABS: 25.0000 mg | ORAL_TABLET | Freq: Every day | ORAL | 3 refills | Status: DC

## 2018-10-02 MED FILL — ?OMEPRAZOLE 20MG CAP DR: 20 | 30 days supply | Qty: 30 | Fill #0

## 2018-10-02 NOTE — Progress Notes (Signed)
Virtual Visit via Telephone Note  I connected with Marcus Solis on 10/02/18 at  3:50 PM EDT by telephone and verified that I am speaking with the correct person using two identifiers.   I discussed the limitations, risks, security and privacy concerns of performing an evaluation and management service by telephone and the availability of in person appointments. I also discussed with the patient that there may be a patient responsible charge related to this service. The patient expressed understanding and agreed to proceed.   History of Present Illness: Mr. Dodger Arango-Ramirezis having a tele visit for pain in his left side below the last rib unable to qualify aggravating factors . Pain 6/10 . He is concerned about his cholesterol a message was left regarding medication and levels. Explanation was given today . [name] Past Medical History:  Diagnosis Date  . Groin pain   . Left inguinal hernia 2015  . Lower abdominal pain   . Lumbago   . Sprain of lumbar region   . Unspecified disorder of male genital organs    Observations/Objective: Review of Systems  Gastrointestinal: Positive for abdominal pain and heartburn.  Psychiatric/Behavioral: Positive for depression.  All other systems are negative  Assessment and Plan: Diagnoses and all orders for this visit:  Mixed hypercholesterolemia and hypertriglyceridemia Foods high in cholesterol or liver, fatty meats,cheese, butter avocados, nuts and seeds, chocolate and fried foods.  TOBACCO DEPENDENCE Nicotine affect every organ in the body second leading cause of death.  Increased risk for lung cancer and other respiratory diseases recommend cessation.  This will be reminded at each clinical visit.  Gastroesophageal reflux disease without esophagitis Discussed eating small frequent meal, reduction in acidic foods, fried foods ,spicy foods, alcohol caffeine and tobacco and certain medications. Avoid laying down after eating  44mins-1hour, elevated head of the bed.  Right lower quadrant abdominal pain Near the site of hernia repair- question is there scar tissue present.  GAD (generalized anxiety disorder) Depression is when you feel down, blue or sad for at least 2 weeks in a row. You may experience increaset increase in sleeping, eating or  loss of interest in doing things that once gave you pleasure. Feeling worthless, guilty, nervous and low self esteem, avoiding interaction with other people or increase agitation. -     escitalopram (LEXAPRO) 20 MG tablet; Take 1 tablet (20 mg total) by mouth daily. -     hydrOXYzine (ATARAX/VISTARIL) 25 MG tablet; Take 1 tablet (25 mg total) by mouth at bedtime.  Other orders -     omeprazole (PRILOSEC) 20 MG capsule; Take 1 capsule (20 mg total) by mouth daily.    Follow Up Instructions:    I discussed the assessment and treatment plan with the patient. The patient was provided an opportunity to ask questions and all were answered. The patient agreed with the plan and demonstrated an understanding of the instructions.   The patient was advised to call back or seek an in-person evaluation if the symptoms worsen or if the condition fails to improve as anticipated.  I provided 21 minutes of non-face-to-face time during this encounter.   Kerin Perna, NP

## 2018-10-02 NOTE — Progress Notes (Signed)
Pain on left side of stomach

## 2018-10-16 ENCOUNTER — Other Ambulatory Visit: Payer: Self-pay

## 2018-10-16 DIAGNOSIS — Z20822 Contact with and (suspected) exposure to covid-19: Secondary | ICD-10-CM

## 2018-10-17 LAB — NOVEL CORONAVIRUS, NAA: SARS-CoV-2, NAA: NOT DETECTED

## 2018-11-01 ENCOUNTER — Other Ambulatory Visit: Payer: Self-pay

## 2018-11-01 DIAGNOSIS — Z20822 Contact with and (suspected) exposure to covid-19: Secondary | ICD-10-CM

## 2018-11-02 LAB — NOVEL CORONAVIRUS, NAA: SARS-CoV-2, NAA: NOT DETECTED

## 2018-11-18 MED FILL — ?OMEPRAZOLE 20MG CAP DR: 20 | 30 days supply | Qty: 30 | Fill #1

## 2018-11-18 MED FILL — ESCITALOPRAM 20 MG TABLET: 20 | 30 days supply | Qty: 30 | Fill #4

## 2018-11-18 MED FILL — ?ATORVASTATIN 40MG TABLET: 40 | 30 days supply | Qty: 30 | Fill #4

## 2018-12-16 ENCOUNTER — Encounter (INDEPENDENT_AMBULATORY_CARE_PROVIDER_SITE_OTHER): Payer: Self-pay | Admitting: Primary Care

## 2018-12-16 ENCOUNTER — Ambulatory Visit (INDEPENDENT_AMBULATORY_CARE_PROVIDER_SITE_OTHER): Payer: Self-pay | Admitting: Primary Care

## 2018-12-16 ENCOUNTER — Other Ambulatory Visit: Payer: Self-pay

## 2018-12-16 DIAGNOSIS — R0781 Pleurodynia: Secondary | ICD-10-CM

## 2018-12-16 DIAGNOSIS — F5101 Primary insomnia: Secondary | ICD-10-CM

## 2018-12-16 DIAGNOSIS — F411 Generalized anxiety disorder: Secondary | ICD-10-CM

## 2018-12-16 MED ORDER — HYDROXYZINE HCL 25 MG PO TABS: 25.0000 mg | ORAL_TABLET | Freq: Every day | ORAL | 3 refills | Status: DC

## 2018-12-16 MED ORDER — ESCITALOPRAM OXALATE 20 MG PO TABS: 20.0000 mg | ORAL_TABLET | Freq: Every day | ORAL | 5 refills | Status: DC

## 2018-12-16 NOTE — Progress Notes (Signed)
Virtual Visit via Telephone Note  I connected with Marcus Solis on 12/16/18 at  3:50 PM EST by telephone and verified that I am speaking with the correct person using two identifiers.   I discussed the limitations, risks, security and privacy concerns of performing an evaluation and management service by telephone and the availability of in person appointments. I also discussed with the patient that there may be a patient responsible charge related to this service. The patient expressed understanding and agreed to proceed.   History of Present Illness: Marcus Solis is having pain under rib on left side denies chest pain. He did have rib fractures approximately 10 years ago. Concern about this sharp and stabbing pain with breath and requesting a MRI. Explained his insurance would not cover it. Patient agreed to pay out of pocket.Patient will need an in person visit befor any imaging done.   Past Medical History:  Diagnosis Date  . Groin pain   . Left inguinal hernia 2015  . Lower abdominal pain   . Lumbago   . Sprain of lumbar region   . Unspecified disorder of male genital organs    Observations/Objective: Review of Systems  Musculoskeletal:       Rib pain   All other systems reviewed and are negative.  Assessment and Plan: [name] was seen today for pain.  Diagnoses and all orders for this visit:  Primary insomnia Managed with      hydrOXYzine (ATARAX/VISTARIL) 25 MG tablet; Take 1 tablet (25 mg total) by mouth at bedtime.  GAD (generalized anxiety disorder) Managed with- -     escitalopram (LEXAPRO) 20 MG tablet; Take 1 tablet (20 mg total) by mouth daily.  Rib pain In person visit to determine location, duration and pain or if any weakness or loss of ROM. At that time will discuss imaging  Follow Up Instructions:    I discussed the assessment and treatment plan with the patient. The patient was provided an opportunity to ask questions and all were  answered. The patient agreed with the plan and demonstrated an understanding of the instructions.   The patient was advised to call back or seek an in-person evaluation if the symptoms worsen or if the condition fails to improve as anticipated.  I provided 18 minutes of non-face-to-face time during this encounter.   Kerin Perna, NP

## 2018-12-16 NOTE — Progress Notes (Signed)
No more dizziness. Discomfort in rib area- feels like inflammation Has had 4 previous hernia surgeries

## 2018-12-17 ENCOUNTER — Other Ambulatory Visit: Payer: Self-pay | Admitting: Cardiology

## 2018-12-17 DIAGNOSIS — Z20822 Contact with and (suspected) exposure to covid-19: Secondary | ICD-10-CM

## 2018-12-17 MED FILL — hydrOXYzine HCL 25 MG TABS: 25 | 30 days supply | Qty: 30 | Fill #0

## 2018-12-17 MED FILL — ESCITALOPRAM 20 MG TABLET: 20 | 30 days supply | Qty: 30 | Fill #0

## 2018-12-19 LAB — NOVEL CORONAVIRUS, NAA: SARS-CoV-2, NAA: NOT DETECTED

## 2018-12-20 MED FILL — ?OMEPRAZOLE 20MG CAP DR: 20 | 30 days supply | Qty: 30 | Fill #2

## 2019-02-03 ENCOUNTER — Ambulatory Visit (INDEPENDENT_AMBULATORY_CARE_PROVIDER_SITE_OTHER): Payer: Medicaid Other | Admitting: Primary Care

## 2019-02-04 MED FILL — ?ATORVASTATIN 40MG TABLET: 40 | 30 days supply | Qty: 30 | Fill #5

## 2019-02-04 MED FILL — hydrOXYzine HCL 25 MG TABS: 25 | 30 days supply | Qty: 30 | Fill #1

## 2019-02-13 ENCOUNTER — Ambulatory Visit (INDEPENDENT_AMBULATORY_CARE_PROVIDER_SITE_OTHER): Payer: Medicaid Other | Admitting: Primary Care

## 2019-02-20 MED FILL — ?OMEPRAZOLE 20MG CAP DR: 20 | 30 days supply | Qty: 30 | Fill #3

## 2019-02-22 ENCOUNTER — Other Ambulatory Visit: Payer: Self-pay

## 2019-02-22 DIAGNOSIS — Z20822 Contact with and (suspected) exposure to covid-19: Secondary | ICD-10-CM

## 2019-02-23 LAB — NOVEL CORONAVIRUS, NAA: SARS-CoV-2, NAA: NOT DETECTED

## 2019-03-07 ENCOUNTER — Other Ambulatory Visit (INDEPENDENT_AMBULATORY_CARE_PROVIDER_SITE_OTHER): Payer: Self-pay | Admitting: Primary Care

## 2019-03-07 MED FILL — hydrOXYzine HCL 25 MG TABS: 25 | 30 days supply | Qty: 30 | Fill #2

## 2019-03-11 ENCOUNTER — Other Ambulatory Visit (INDEPENDENT_AMBULATORY_CARE_PROVIDER_SITE_OTHER): Payer: Self-pay | Admitting: Primary Care

## 2019-03-11 DIAGNOSIS — E782 Mixed hyperlipidemia: Secondary | ICD-10-CM

## 2019-03-11 NOTE — Telephone Encounter (Signed)
Sent to PCP ?

## 2019-03-12 NOTE — Telephone Encounter (Signed)
Denied last lipids 2018

## 2019-03-13 ENCOUNTER — Other Ambulatory Visit (INDEPENDENT_AMBULATORY_CARE_PROVIDER_SITE_OTHER): Payer: Self-pay | Admitting: Primary Care

## 2019-03-13 DIAGNOSIS — E782 Mixed hyperlipidemia: Secondary | ICD-10-CM

## 2019-03-13 MED ORDER — ATORVASTATIN CALCIUM 40 MG PO TABS: 40.0000 mg | ORAL_TABLET | Freq: Every day | ORAL | 1 refills | Status: DC

## 2019-03-13 MED FILL — ?ATORVASTATIN 40MG TABL: 40 | 30 days supply | Qty: 30 | Fill #0

## 2019-03-14 ENCOUNTER — Other Ambulatory Visit (INDEPENDENT_AMBULATORY_CARE_PROVIDER_SITE_OTHER): Payer: Self-pay | Admitting: Primary Care

## 2019-03-14 ENCOUNTER — Other Ambulatory Visit: Payer: Self-pay

## 2019-03-14 ENCOUNTER — Other Ambulatory Visit (INDEPENDENT_AMBULATORY_CARE_PROVIDER_SITE_OTHER): Payer: Medicaid Other

## 2019-03-14 DIAGNOSIS — E782 Mixed hyperlipidemia: Secondary | ICD-10-CM

## 2019-03-15 LAB — CMP14+EGFR
ALT: 40 IU/L (ref 0–44)
AST: 25 IU/L (ref 0–40)
Albumin/Globulin Ratio: 1.8 (ref 1.2–2.2)
Albumin: 4.5 g/dL (ref 3.8–4.9)
Alkaline Phosphatase: 116 IU/L (ref 39–117)
BUN/Creatinine Ratio: 18 (ref 9–20)
BUN: 15 mg/dL (ref 6–24)
Bilirubin Total: 0.6 mg/dL (ref 0.0–1.2)
CO2: 22 mmol/L (ref 20–29)
Calcium: 9 mg/dL (ref 8.7–10.2)
Chloride: 102 mmol/L (ref 96–106)
Creatinine, Ser: 0.82 mg/dL (ref 0.76–1.27)
GFR calc Af Amer: 112 mL/min/{1.73_m2} (ref >59)
GFR calc non Af Amer: 97 mL/min/{1.73_m2} (ref >59)
Globulin, Total: 2.5 g/dL (ref 1.5–4.5)
Glucose: 117 mg/dL — ABNORMAL HIGH (ref 65–99)
Potassium: 4.2 mmol/L (ref 3.5–5.2)
Sodium: 140 mmol/L (ref 134–144)
Total Protein: 7 g/dL (ref 6.0–8.5)

## 2019-03-15 LAB — LIPID PANEL
Chol/HDL Ratio: 4.5 ratio (ref 0.0–5.0)
Cholesterol, Total: 166 mg/dL (ref 100–199)
HDL: 37 mg/dL — ABNORMAL LOW (ref >39)
LDL Chol Calc (NIH): 97 mg/dL (ref 0–99)
Triglycerides: 187 mg/dL — ABNORMAL HIGH (ref 0–149)
VLDL Cholesterol Cal: 32 mg/dL (ref 5–40)

## 2019-05-07 MED FILL — ATORVASTATIN CALCIUM 40 MG: 40 | 30 days supply | Qty: 30 | Fill #1

## 2019-05-07 MED FILL — hydrOXYzine HCL 25 MG TABS: 25 | 30 days supply | Qty: 30 | Fill #3

## 2019-05-15 ENCOUNTER — Ambulatory Visit: Payer: Medicaid Other

## 2019-06-07 IMAGING — CT CT ABD-PELV W/ CM
2 of 5 series · 16 of 46 positions shown, 18 images · IV contrast (agent unspecified)
Comparison: None.

CLINICAL DATA: Lower abdominal pain on the left for several weeks,
initial encounter

EXAM:
CT ABDOMEN AND PELVIS WITH CONTRAST
TECHNIQUE: Multidetector CT imaging of the abdomen and pelvis was performed
using the standard protocol following bolus administration of
intravenous contrast.
CONTRAST:  100 mL [id number].

[Series 3: a/p w/ 5mm · axial · 0.83mm/px · z∈[+625,+1065]mm · 13 of 100 slices shown, 15 images]
[im 6/100  soft-tissue]
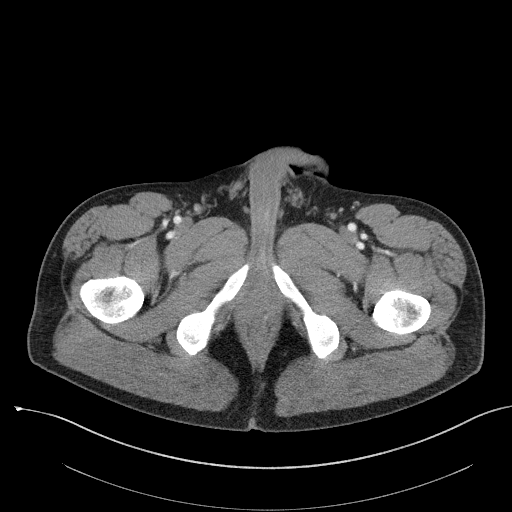
[im 6/100  bone]
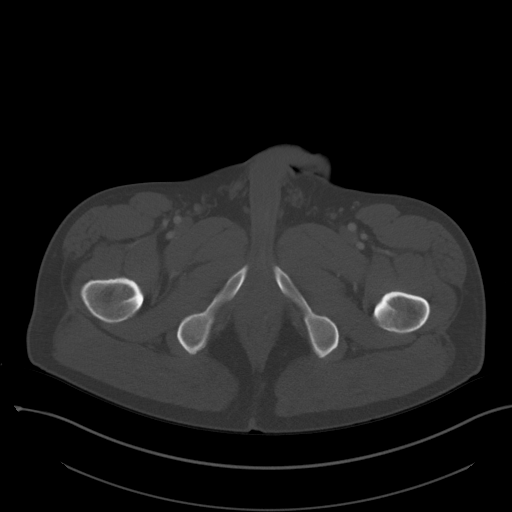
[im 12/100  soft-tissue]
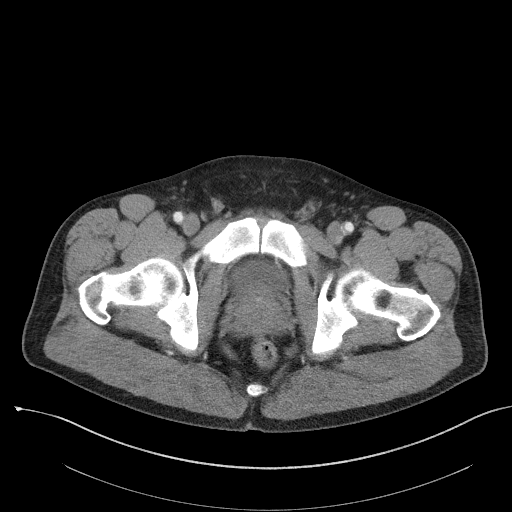
[im 23/100  soft-tissue]
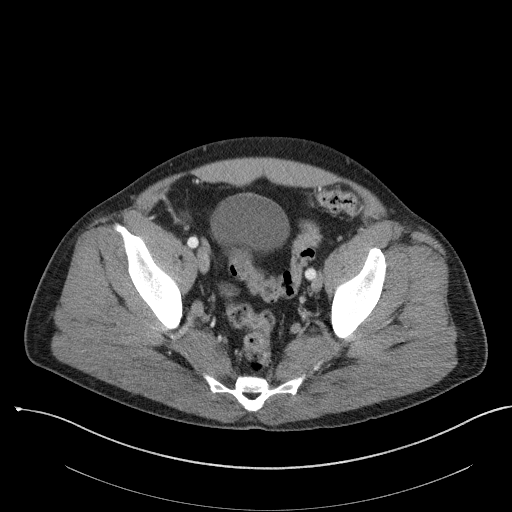
[im 28/100  soft-tissue]
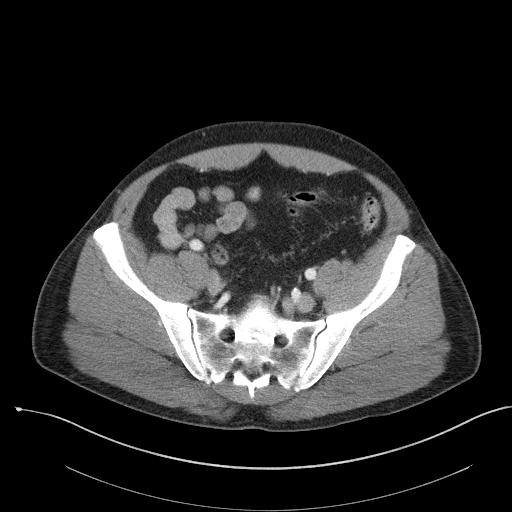
[im 34/100  soft-tissue]
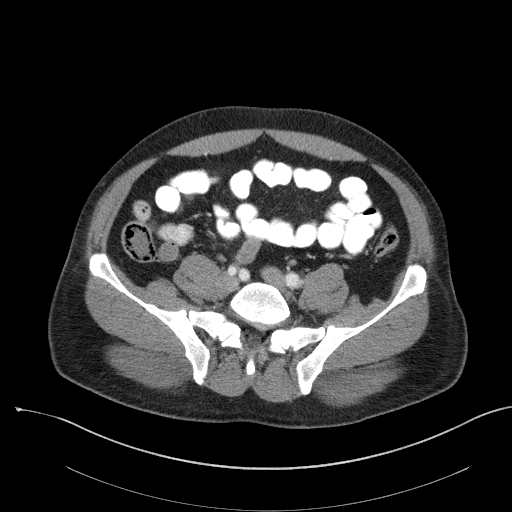
[im 45/100  soft-tissue]
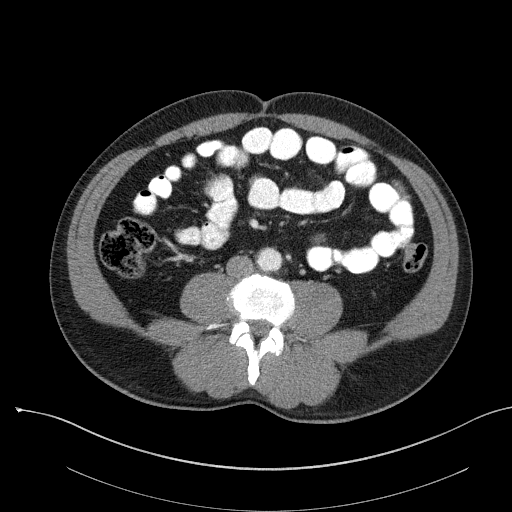
[im 50/100  soft-tissue]
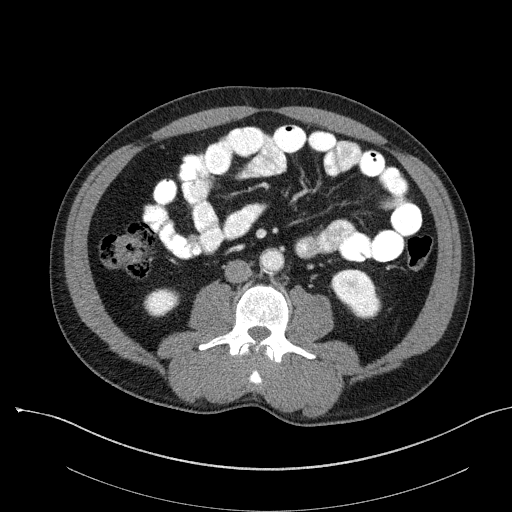
[im 56/100  soft-tissue]
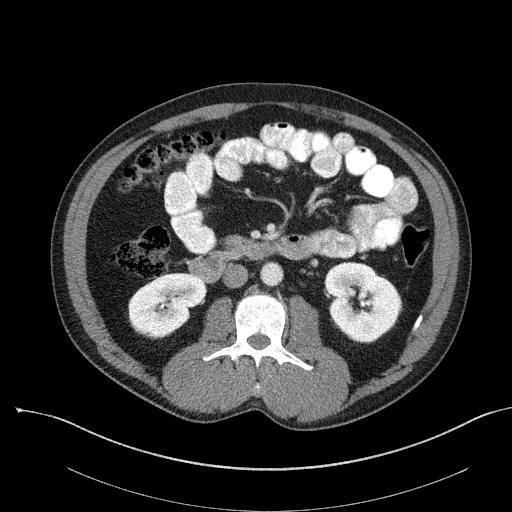
[im 67/100  soft-tissue]
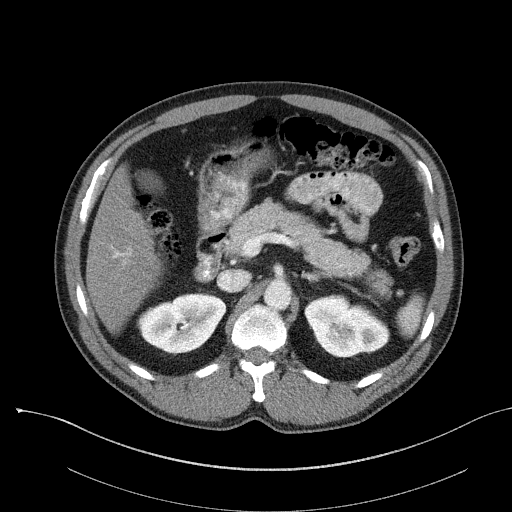
[im 67/100  bone]
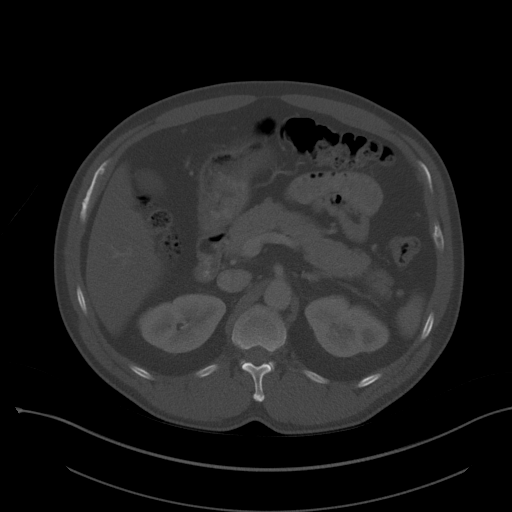
[im 72/100  soft-tissue]
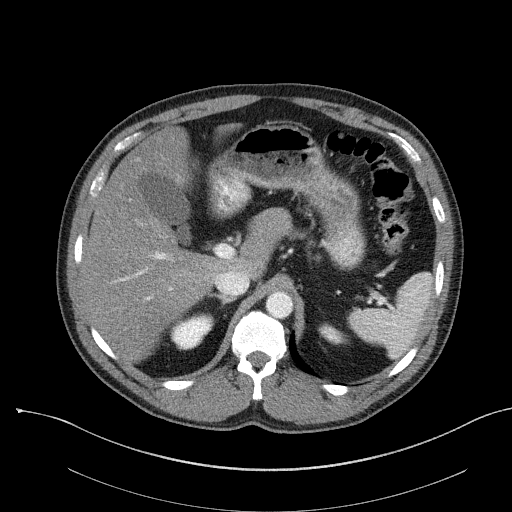
[im 78/100  soft-tissue]
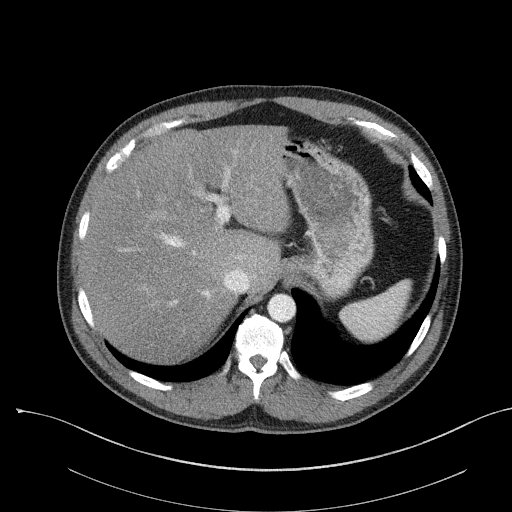
[im 89/100  soft-tissue]
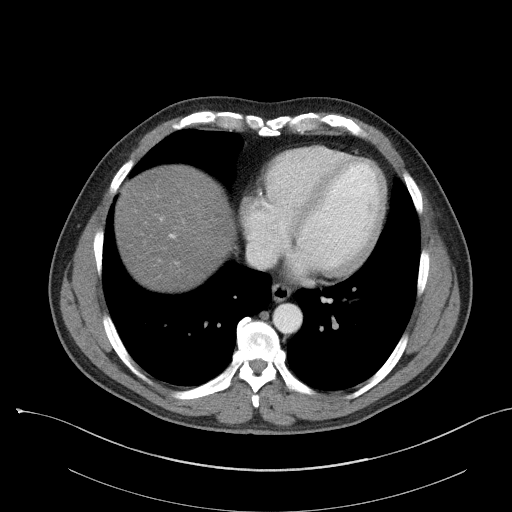
[im 94/100  soft-tissue]
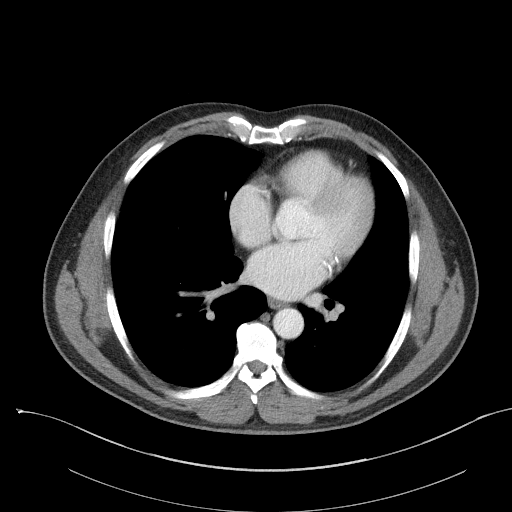

[Series 6: a/p w/ cor · coronal · 0.80mm/px · 3 of 133 slices shown]
[im 45/133  soft-tissue]
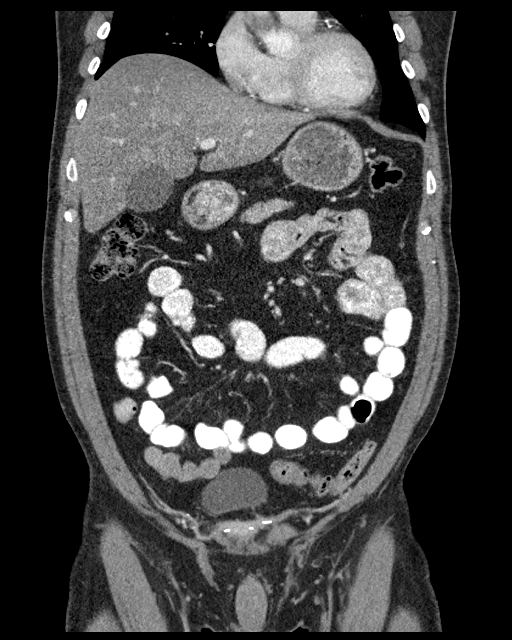
[im 59/133  soft-tissue]
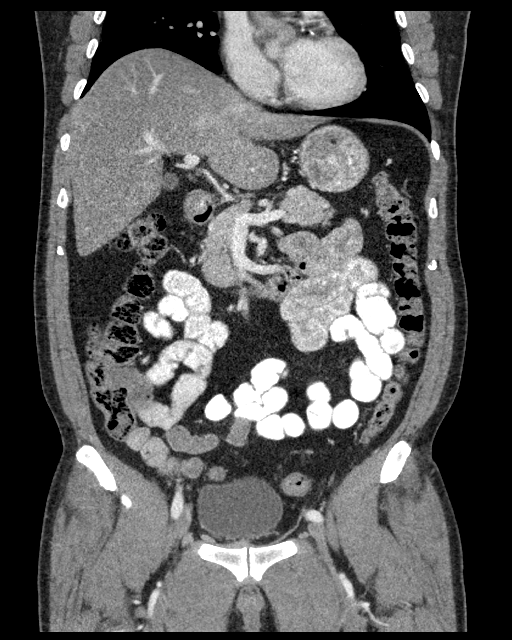
[im 74/133  soft-tissue]
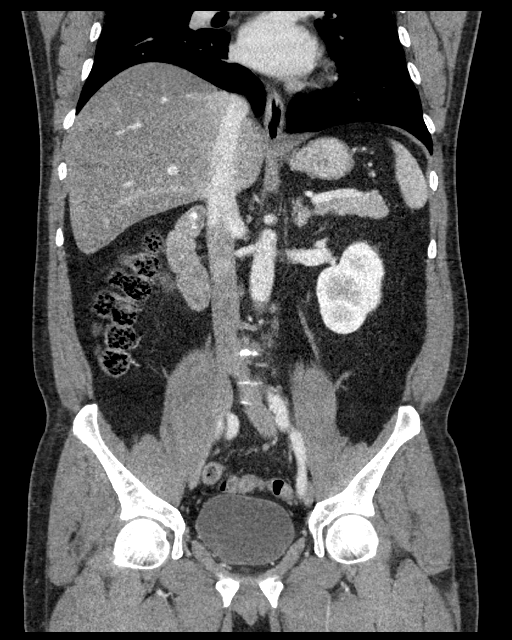

[16 of 46 positions shown; findings below may reference images not displayed]

FINDINGS: Lower chest: Calcified granuloma is noted in the right lower lobe
posteriorly. No other focal abnormality is seen.

Hepatobiliary: Fatty infiltration of the liver is noted. The
gallbladder is within normal limits.

Pancreas: Unremarkable. No pancreatic ductal dilatation or
surrounding inflammatory changes.

Spleen: Normal in size without focal abnormality.

Adrenals/Urinary Tract: Adrenal glands are unremarkable. Kidneys are
normal, without renal calculi, focal lesion, or hydronephrosis.
Bladder is unremarkable.

Stomach/Bowel: The appendix is not well visualized although no
inflammatory changes to suggest appendicitis are identified. No
evidence of diverticulosis is seen. No obstructive or inflammatory
changes are noted.

Vascular/Lymphatic: Aortic atherosclerosis. No enlarged abdominal or
pelvic lymph nodes.

Reproductive: Prostate is unremarkable.

Other: No abdominal wall hernia or abnormality. No abdominopelvic
ascites. Postsurgical changes are noted in the pelvis consistent
with the known history of prior hernia repair.

Musculoskeletal: No acute or significant osseous findings.
IMPRESSION: Chronic changes without acute abnormality.

## 2019-06-28 IMAGING — DX DG TIBIA/FIBULA 2V*R*
3 series · 3 of 3 positions shown · non-contrast
Comparison: None.

CLINICAL DATA: Initial evaluation for acute medial right leg
swelling.

EXAM:
RIGHT TIBIA AND FIBULA - 2 VIEW

[tibia ap]
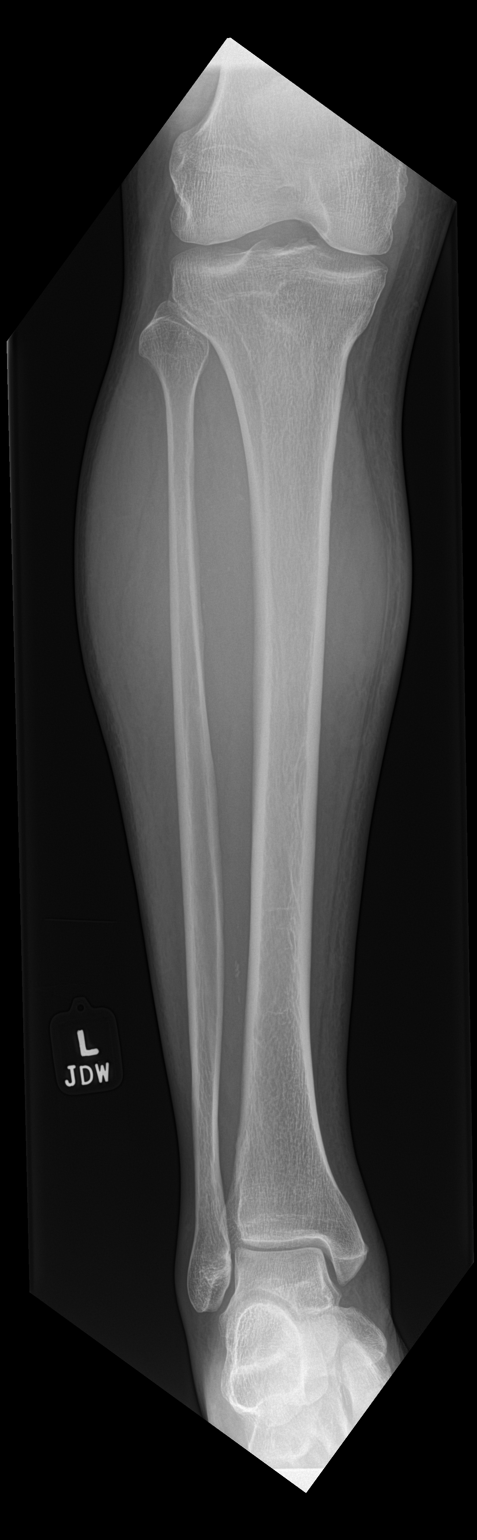

[tibia lat (1 of 2)]
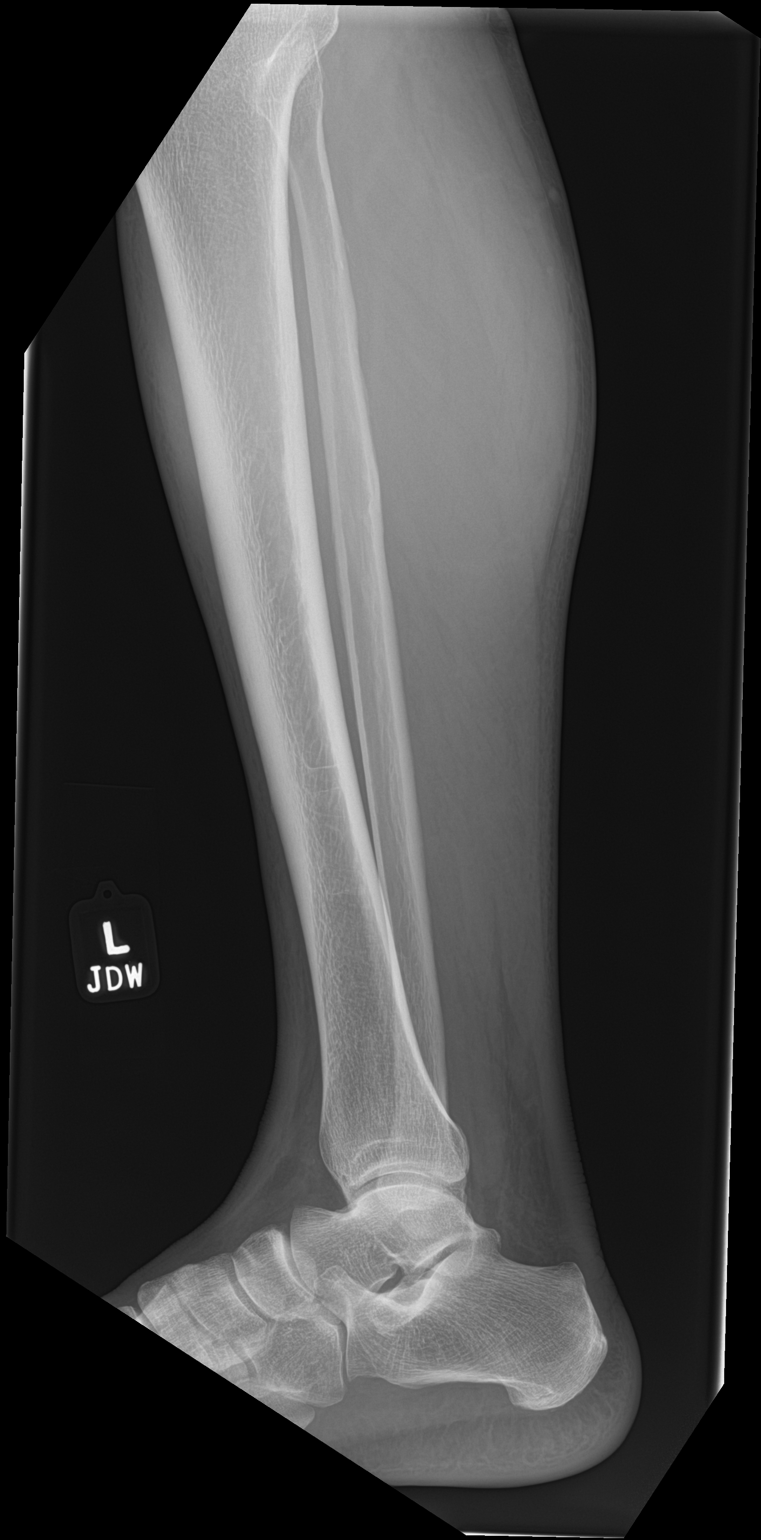

[tibia lat (2 of 2)]
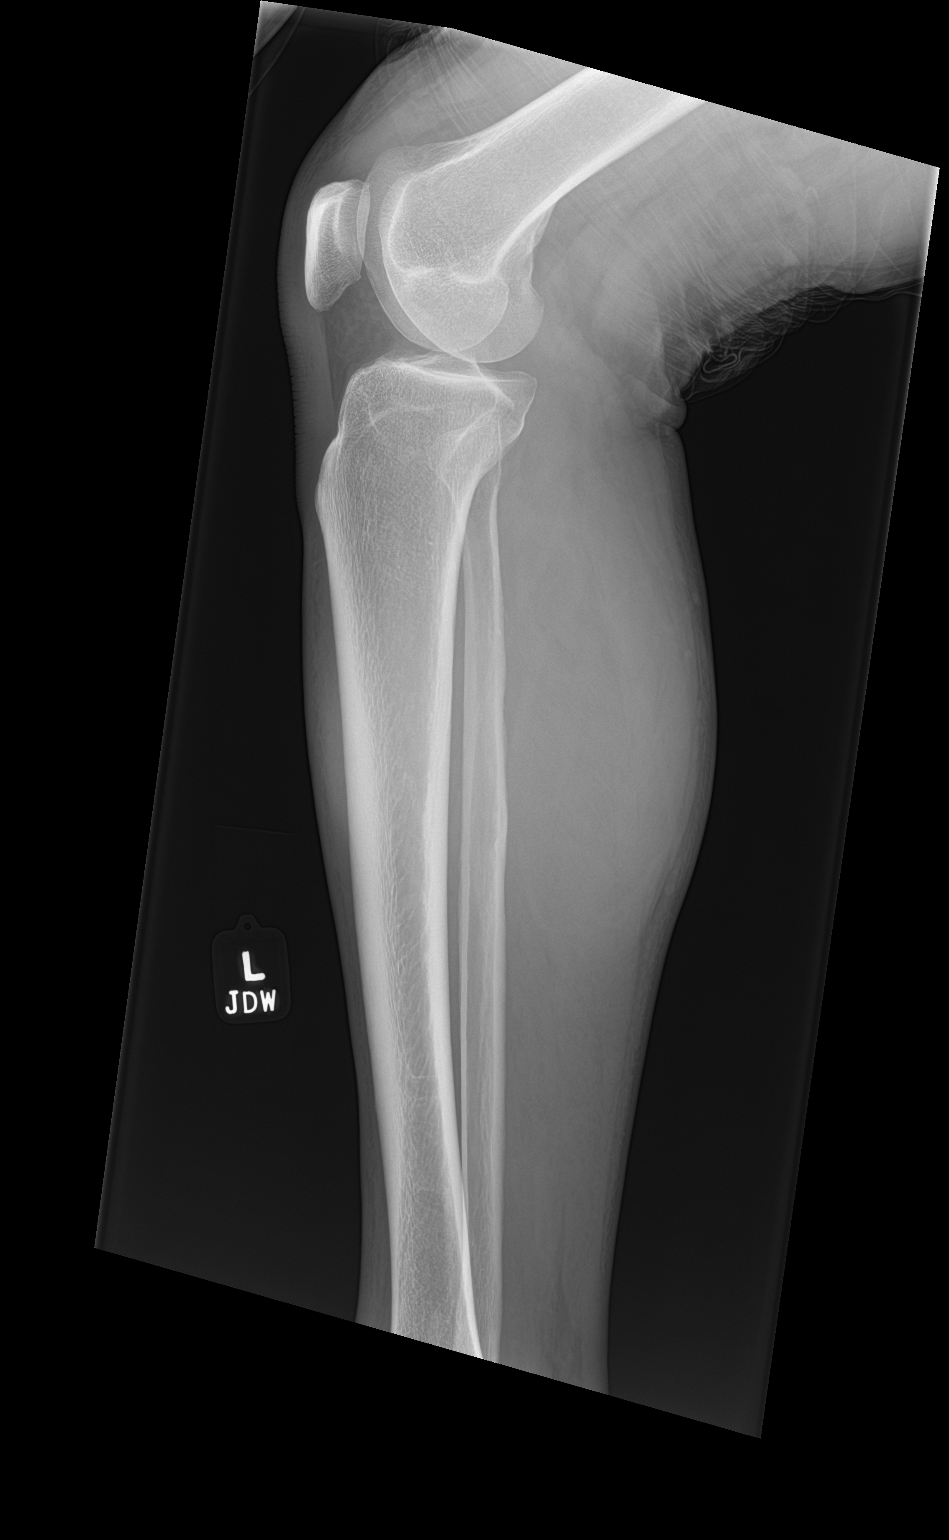

[3 of 3 positions shown; findings below may reference images not displayed]

FINDINGS: No acute fracture or dislocation. Limited views of the knee and
ankle are grossly unremarkable. No discrete osseous lesions. No
significant soft tissue swelling or other acute abnormality
identified. Few scattered vascular calcifications noted within the
leg. No radiopaque foreign body.
IMPRESSION: 1. No radiographic evidence for acute osseous abnormality about the
right tibia/fibula.
2. No acute soft tissue abnormality identified.
3. Mild scattered atherosclerotic change within the right leg.

## 2019-07-30 ENCOUNTER — Telehealth (INDEPENDENT_AMBULATORY_CARE_PROVIDER_SITE_OTHER): Payer: Medicaid Other | Admitting: Primary Care

## 2020-01-23 ENCOUNTER — Other Ambulatory Visit: Payer: Medicaid Other

## 2020-01-23 DIAGNOSIS — Z20822 Contact with and (suspected) exposure to covid-19: Secondary | ICD-10-CM

## 2020-01-24 LAB — NOVEL CORONAVIRUS, NAA: SARS-CoV-2, NAA: NOT DETECTED

## 2020-01-24 LAB — SARS-COV-2, NAA 2 DAY TAT

## 2020-01-26 ENCOUNTER — Ambulatory Visit: Payer: Self-pay | Admitting: *Deleted

## 2020-01-26 ENCOUNTER — Encounter: Payer: Self-pay | Admitting: *Deleted

## 2020-01-26 NOTE — Telephone Encounter (Addendum)
Pt notified of negative COVID-19 results. Understanding verbalized. Patient viewed results via My Chart on 01/25/20 at 4:56pm.  Addendum: Patient called via interpreter [phone] to request covid test results.

## 2020-04-09 ENCOUNTER — Ambulatory Visit: Payer: Self-pay | Admitting: Internal Medicine

## 2020-09-21 ENCOUNTER — Other Ambulatory Visit: Payer: Self-pay

## 2020-09-21 ENCOUNTER — Telehealth: Payer: Self-pay | Admitting: Primary Care

## 2020-09-21 ENCOUNTER — Ambulatory Visit: Payer: Commercial Managed Care - PPO | Admitting: Physician Assistant

## 2020-09-21 VITALS — BP 145/89 | HR 56 | Temp 98.5°F | Resp 18 | Ht 66.0 in | Wt 182.0 lb

## 2020-09-21 DIAGNOSIS — F5104 Psychophysiologic insomnia: Secondary | ICD-10-CM

## 2020-09-21 DIAGNOSIS — Z6829 Body mass index (BMI) 29.0-29.9, adult: Secondary | ICD-10-CM

## 2020-09-21 DIAGNOSIS — E782 Mixed hyperlipidemia: Secondary | ICD-10-CM | POA: Diagnosis not present

## 2020-09-21 DIAGNOSIS — R030 Elevated blood-pressure reading, without diagnosis of hypertension: Secondary | ICD-10-CM | POA: Diagnosis not present

## 2020-09-21 DIAGNOSIS — Z125 Encounter for screening for malignant neoplasm of prostate: Secondary | ICD-10-CM

## 2020-09-21 DIAGNOSIS — F411 Generalized anxiety disorder: Secondary | ICD-10-CM

## 2020-09-21 MED ORDER — HYDROXYZINE HCL 50 MG PO TABS
ORAL_TABLET | ORAL | 1 refills | Status: DC
  Filled 2020-09-21: qty 30, 30d supply, fill #0

## 2020-09-21 MED ORDER — ESCITALOPRAM OXALATE 20 MG PO TABS
20.0000 mg | ORAL_TABLET | Freq: Every day | ORAL | 5 refills | Status: DC
  Filled 2020-09-21: qty 30, 30d supply, fill #0
  Filled 2020-10-22: qty 30, 30d supply, fill #1
  Filled 2020-11-24 – 2020-11-25 (×2): qty 30, 30d supply, fill #2

## 2020-09-21 MED ORDER — ATORVASTATIN CALCIUM 40 MG PO TABS
40.0000 mg | ORAL_TABLET | Freq: Every day | ORAL | 1 refills | Status: DC
  Filled 2020-09-21: qty 30, 30d supply, fill #0
  Filled 2020-10-22: qty 30, 30d supply, fill #1
  Filled 2020-11-24 – 2020-11-25 (×2): qty 30, 30d supply, fill #2
  Filled 2021-01-10: qty 30, 30d supply, fill #3

## 2020-09-21 NOTE — Progress Notes (Signed)
Patient has not eaten today Patient has not taken medication today. Patient reports not being able to fall asleep for years and recently concern has been more prominent with melatonin providing minimal relief.

## 2020-09-21 NOTE — Telephone Encounter (Signed)
Pt son called expressing father wants help with a sleep aid

## 2020-09-21 NOTE — Telephone Encounter (Signed)
Patient needs an appointment made with his PCP to discuss the concern.

## 2020-09-21 NOTE — Progress Notes (Signed)
Established Patient Office Visit  Subjective:  Patient ID: Marcus Solis, male    DOB: 10-May-1960  Age: 60 y.o. MRN: [id number]  CC:  Chief Complaint  Patient presents with   Insomnia     HPI Marcus Solis states that he has been having a had time sleeping, trouble falling asleep, endorses good sleep hygiene Has tried melatonin 10 mg without relief, states that the hydroxyzine 25 mg did offer a small amount of relief, states that he ran out of the hydroxyzine due to not having refills.  States that he also is no longer taking the Lexapro, but only because he did not have any refills.  States that he would like to restart it, does feel that it did start to offer some relief.  Worried abut cholesterol as well, states that he did take atorvastatin in 2021, but states that he did not have any more refills and has not taken it in the past 3 months   States that he does not check his blood pressure at home, denies any hypertensive symptoms.  Due to language barrier, an interpreter was present during the history-taking and subsequent discussion (and for part of the physical exam) with this patient.    Past Medical History:  Diagnosis Date   Groin pain    Left inguinal hernia 2015   Lower abdominal pain    Lumbago    Sprain of lumbar region    Unspecified disorder of male genital organs     Past Surgical History:  Procedure Laterality Date   COLONOSCOPY N/A 09/26/2017   Procedure: COLONOSCOPY;  Surgeon: Daneil Dolin, MD;  Location: [facility];  Service: Endoscopy;  Laterality: N/A;  12:00-rescheduled to 8/21 @ 10:30am per [place]     POLYPECTOMY  09/26/2017   Procedure: POLYPECTOMY;  Surgeon: Daneil Dolin, MD;  Location: [facility];  Service: Endoscopy;;    Family History  Problem Relation Age of Onset   Hyperlipidemia Mother    Cancer Father    Hypertension Sister     Social History   Socioeconomic History   Marital status:  Married    Spouse name: Not on file   Number of children: Not on file   Years of education: Not on file   Highest education level: Not on file  Occupational History   Not on file  Tobacco Use   Smoking status: Former   Smokeless tobacco: Never   Tobacco comments:    quit 10 years ago   Vaping Use   Vaping Use: Never used  Substance and Sexual Activity   Alcohol use: No   Drug use: No   Sexual activity: Not on file  Other Topics Concern   Not on file  Social History Narrative   Not on file   Social Determinants of Health   Financial Resource Strain: Not on file  Food Insecurity: Not on file  Transportation Needs: Not on file  Physical Activity: Not on file  Stress: Not on file  Social Connections: Not on file  Intimate Partner Violence: Not on file    Outpatient Medications Prior to Visit  Medication Sig Dispense Refill   omeprazole (PRILOSEC) 20 MG capsule TAKE 1 CAPSULE (20 MG TOTAL) BY MOUTH DAILY. 30 capsule 3   atorvastatin (LIPITOR) 40 MG tablet Take 1 tablet (40 mg total) by mouth daily. 90 tablet 1   ezetimibe (ZETIA) 10 MG tablet Take 1 tablet (10 mg total) by mouth daily. (Patient  not taking: No sig reported) 90 tablet 3   tiZANidine (ZANAFLEX) 2 MG tablet Take 1-2 tablets (2-4 mg total) by mouth at bedtime as needed for muscle spasms. (Patient not taking: No sig reported) 60 tablet 1   escitalopram (LEXAPRO) 20 MG tablet Take 1 tablet (20 mg total) by mouth daily. (Patient not taking: Reported on 09/21/2020) 30 tablet 5   hydrOXYzine (ATARAX/VISTARIL) 25 MG tablet Take 1 tablet (25 mg total) by mouth at bedtime. (Patient not taking: Reported on 09/21/2020) 30 tablet 3   No facility-administered medications prior to visit.    No Known Allergies  ROS Review of Systems  Constitutional: Negative.   HENT: Negative.    Eyes: Negative.   Respiratory:  Negative for shortness of breath.   Cardiovascular:  Negative for chest pain.  Gastrointestinal: Negative.    Endocrine: Negative.   Genitourinary: Negative.   Musculoskeletal: Negative.   Skin: Negative.   Allergic/Immunologic: Negative.   Neurological: Negative.   Hematological: Negative.   Psychiatric/Behavioral:  Positive for sleep disturbance. Negative for dysphoric mood, self-injury and suicidal ideas. The patient is nervous/anxious.      Objective:    Physical Exam Vitals and nursing note reviewed.  Constitutional:      Appearance: Normal appearance.  HENT:     Head: Normocephalic and atraumatic.     Right Ear: External ear normal.     Left Ear: External ear normal.     Nose: Nose normal.     Mouth/Throat:     Mouth: Mucous membranes are moist.     Pharynx: Oropharynx is clear.  Eyes:     Extraocular Movements: Extraocular movements intact.     Conjunctiva/sclera: Conjunctivae normal.     Pupils: Pupils are equal, round, and reactive to light.  Cardiovascular:     Rate and Rhythm: Normal rate and regular rhythm.     Pulses: Normal pulses.     Heart sounds: Normal heart sounds.  Pulmonary:     Effort: Pulmonary effort is normal.     Breath sounds: Normal breath sounds.  Musculoskeletal:        General: Normal range of motion.     Cervical back: Normal range of motion and neck supple.  Skin:    General: Skin is warm and dry.  Neurological:     General: No focal deficit present.     Mental Status: He is alert and oriented to person, place, and time.  Psychiatric:        Mood and Affect: Mood normal.        Behavior: Behavior normal.        Thought Content: Thought content normal.        Judgment: Judgment normal.    BP (!) 145/89 (BP Location: Left Arm, Patient Position: Sitting, Cuff Size: Normal)   Pulse (!) 56   Temp 98.5 F (36.9 C) (Oral)   Resp 18   Ht '5\' 6"'  (1.676 m)   Wt 182 lb (82.6 kg)   SpO2 95%   BMI 29.38 kg/m  Wt Readings from Last 3 Encounters:  09/21/20 182 lb (82.6 kg)  04/12/18 181 lb (82.1 kg)  04/11/18 181 lb 8 oz (82.3 kg)      Health Maintenance Due  Topic Date Due   COVID-19 Vaccine (1) Never done   Zoster Vaccines- Shingrix (1 of 2) Never done   INFLUENZA VACCINE  09/06/2020    There are no preventive care reminders to display for this patient.  Lab Results  Component Value Date   TSH 3.400 03/15/2017   Lab Results  Component Value Date   WBC 4.2 09/21/2020   HGB 17.0 09/21/2020   HCT 50.0 09/21/2020   MCV 90 09/21/2020   PLT 260 09/21/2020   Lab Results  Component Value Date   NA 138 09/21/2020   K 4.5 09/21/2020   CO2 22 03/14/2019   GLUCOSE 114 (H) 09/21/2020   BUN 13 09/21/2020   CREATININE 0.66 (L) 09/21/2020   BILITOT 0.6 09/21/2020   ALKPHOS 103 09/21/2020   AST 29 09/21/2020   ALT 40 03/14/2019   PROT 7.0 09/21/2020   ALBUMIN 4.5 09/21/2020   CALCIUM 8.9 09/21/2020   EGFR 107 09/21/2020   Lab Results  Component Value Date   CHOL 230 (H) 09/21/2020   Lab Results  Component Value Date   HDL 34 (L) 09/21/2020   Lab Results  Component Value Date   LDLCALC 145 (H) 09/21/2020   Lab Results  Component Value Date   TRIG 279 (H) 09/21/2020   Lab Results  Component Value Date   CHOLHDL 6.8 (H) 09/21/2020   No results found for: HGBA1C    Assessment & Plan:   Problem List Items Addressed This Visit       Other   Psychophysiological insomnia - Primary   GAD (generalized anxiety disorder)   Relevant Medications   hydrOXYzine (ATARAX/VISTARIL) 50 MG tablet   escitalopram (LEXAPRO) 20 MG tablet   Other Relevant Orders   CBC with Differential/Platelet (Completed)   Comp. Metabolic Panel (12) (Completed)   Mixed hypercholesterolemia and hypertriglyceridemia   Relevant Medications   atorvastatin (LIPITOR) 40 MG tablet   Other Relevant Orders   Lipid panel (Completed)   Elevated blood pressure reading without diagnosis of hypertension   Other Visit Diagnoses     Screening PSA (prostate specific antigen)       Relevant Orders   PSA (Completed)   BMI  29.0-29.9,adult           Meds ordered this encounter  Medications   hydrOXYzine (ATARAX/VISTARIL) 50 MG tablet    Sig: Take 1/2 - 1 full tab PO QHS PRN    Dispense:  30 tablet    Refill:  1    Dose change    Order Specific Question:   Supervising Provider    Answer:   Asencion Noble E [[id number]]   atorvastatin (LIPITOR) 40 MG tablet    Sig: Take 1 tablet (40 mg total) by mouth daily.    Dispense:  90 tablet    Refill:  1    Order Specific Question:   Supervising Provider    Answer:   Asencion Noble E [[id number]]   escitalopram (LEXAPRO) 20 MG tablet    Sig: Take 1 tablet (20 mg total) by mouth daily.    Dispense:  30 tablet    Refill:  5    Order Specific Question:   Supervising Provider    Answer:   Joya Gaskins, PATRICK E [[id number]]   1. Psychophysiological insomnia Patient encouraged to resume hydroxyzine as needed.  Patient did want to try a higher dose.  Patient education given on good sleep hygiene.  Patient has appointment with primary care provider in 2 weeks.  Patient encouraged to return to mobile unit if needed or follow-up with primary care provider for further evaluation.  Fasting labs completed today  2. GAD (generalized anxiety disorder) Resume Lexapro - CBC with Differential/Platelet - Comp. Metabolic Panel (12) - hydrOXYzine (ATARAX/VISTARIL) 50  MG tablet; Take 1/2 - 1 full tab PO QHS PRN  Dispense: 30 tablet; Refill: 1 - escitalopram (LEXAPRO) 20 MG tablet; Take 1 tablet (20 mg total) by mouth daily.  Dispense: 30 tablet; Refill: 5  3. Mixed hypercholesterolemia and hypertriglyceridemia Resume atorvastatin - Lipid panel - atorvastatin (LIPITOR) 40 MG tablet; Take 1 tablet (40 mg total) by mouth daily.  Dispense: 90 tablet; Refill: 1  4. Elevated blood pressure reading without diagnosis of hypertension Patient encouraged to check blood pressure at home, keep a written log and have available for all office visits.  Red flags given for prompt reevaluation  5. Screening  PSA (prostate specific antigen) - PSA   I have reviewed the patient's medical history (PMH, PSH, Social History, Family History, Medications, and allergies) , and have been updated if relevant. I spent 30 minutes reviewing chart and  face to face time with patient.    Follow-up: Return in about 2 weeks (around 10/05/2020) for with Juluis Mire.    Loraine Grip Mayers, PA-C

## 2020-09-21 NOTE — Patient Instructions (Signed)
You are going to resume your Lexapro on a daily basis.  You will also take an increased dose of hydroxyzine, you can take 1/2 tablet to 1 full tablet at night to help you with sleep.  Your blood pressure is elevated today, I encourage you to check your blood pressure at home on a daily basis, keep a written log and have available for all office visits.  We will call you with your lab results.  Kennieth Rad, PA-C Physician Assistant [facility] [email or web address]  Cmo tomarse la presin arterial How to Take Your Blood Pressure La presin arterial es la medida de la fuerza de la sangre al presionar contra las paredes de las arterias. Las arterias son los vasos sanguneos que transportan la sangre desde el corazn hacia todas las partes del cuerpo. Su mdico toma su presin arterial en cada visita al consultorio. Usted tambinpuede tomarse la presin arterial en casa con un tensimetro. Es posible que deba tomarse la presin arterial: Para confirmar un diagnstico de presin arterial elevada (hipertensin). Para vigilar su presin arterial a lo largo del tiempo. Para asegurarse de que el medicamento que toma para la presin arterial est surtiendo Insurance claims handler. Materiales necesarios: Monitor para medir la presin arterial. Silla de comedor para sentarse. Mesa o escritorio. Cuaderno pequeo y lpiz o bolgrafo. Cmo prepararse Para obtener la lectura ms precisa, evite realizar lo siguiente durante los 30 minutos previos a Chief Technology Officer su presin arterial: Beber cafena. Consumir alcohol. [place]. Fumar. Realizar actividad fsica. Cinco minutos antes de controlar su presin arterial: Vaya al bao y orine para tener la vejiga vaca. Sintese tranquilamente en una silla de comedor. No se siente en un silln blando o sof. No hable. Cmo tomarse la presin arterial Para controlar su presin arterial, siga las instrucciones presentes en el  manual que se incluye con el tensimetro. Si tiene Best boy, las instrucciones podran ser las siguientes: Sintese derecho en una silla. Coloque los pies en el piso. No cruce los tobillos ni las piernas. Apoye su brazo izquierdo al nivel de su corazn en una mesa o escritorio, o en el brazo de la silla. Arremnguese. Envuelva la parte superior de su brazo izquierdo, 1 pulgada (2.5 cm) sobre su codo, con el brazalete. Es mejor Production manager brazalete alrededor de la piel [place]. Ajuste el brazalete alrededor de su brazo. Debe poder meter nicamente un dedo entre el brazalete y Water engineer. Coloque el cordn de modo que quede apoyado en el pliegue del codo. Presione el botn de encendido. Permanezca sentado tranquilamente mientras el brazalete se infla y se desinfla. Lea la lectura digital que aparece en la pantalla del tensimetro y anote los nmeros ([place]) en un cuaderno. Espere de 2 a 3 minutos, y luego repita los pasos desde el paso 1. Qu significa mi lectura de presin arterial? Una lectura de la presin arterial consta de un nmero ms alto sobre un nmero ms bajo. En condiciones ideales, la presin arterial debe estar por debajo de 120/80. El primer nmero ("superior") es la presin sistlica. Es la medida de la presin de las arterias cuando el corazn late. El segundo nmero ("inferior") es la presin diastlica. Es la medida de la presin en lasarterias cuando el corazn se relaja. La presin arterial se clasifica en cinco etapas. Las siguientes son las etapas para adultos que no tienen enfermedad grave de corto plazo o una afeccin crnica. La presin sistlica y la presin diastlica se miden en una unidad llamada mm Hg (milmetros  de mercurio). Normal Presin sistlica: por debajo de 123456. Presin diastlica: por debajo de 80. Elevada Presin sistlica: [id number]. Presin diastlica: por debajo de 80. Etapa 1 de hipertensin Presin sistlica: 0000000. Presin  diastlica: [id number]. Etapa 2 de hipertensin Presin sistlica: [id number] o ms. Presin diastlica: 90 o ms. Puede tener presin arterial elevada o hipertensin incluso si nicamente elnmero sistlico o el diastlico de su lectura es ms elevado de lo normal. Siga estas instrucciones en su casa: Medicamentos Use los medicamentos de venta libre y los recetados solamente como se lo haya indicado el mdico. Dgale a su mdico si tiene efectos secundarios causados por los medicamentos para la presin arterial. Indicaciones generales Mida su presin arterial con la frecuencia recomendada por su mdico. Contrlese la presin arterial a la [employer]. Lleve el tensimetro a su prxima cita con el mdico para asegurarse de lo siguiente: Que lo [place] correctamente. Que genera lecturas precisas. Asegrese de entender cules son sus nmeros objetivo para la presin arterial. Concurra a todas las visitas de seguimiento como se lo haya indicado el mdico. Esto es importante. Consejos generales Su mdico puede sugerirle un tensimetro confiable que cumpla con sus necesidades. Existen varios tipos de tensimetros para Corporate investment banker. Escoja un tensimetro que tenga un brazalete. No escoja un tensimetro que mida su presin arterial en la mueca o el dedo. Escoja un brazalete que envuelva ceidamente la parte superior de su brazo. Debe poder meter nicamente un dedo entre el brazalete y Water engineer. Puede comprar un tensimetro en la mayora de las farmacias o en lnea. Dnde buscar ms informacin American Heart Association (Asociacin Estadounidense del Corazn): www.heart.org Comunquese con un mdico si: Su presin arterial es sistemticamente alta. Su presin arterial disminuye repentinamente. Solicite ayuda de inmediato si: Su presin arterial sistlica est por encima de 180. Su presin arterial diastlica est por encima de 120. Resumen La presin arterial es la medida de la fuerza de la  sangre al presionar contra las paredes de las arterias. Una lectura de la presin arterial consta de un nmero ms alto sobre un nmero ms bajo. En condiciones ideales, la presin arterial debe estar por debajo de 120/80. Contrlese la presin arterial a la [employer]. Evite la cafena, el alcohol, fumar y hacer actividad fsica durante los 30 minutos anteriores a controlarse la presin arterial. Estos factores pueden afectar la precisin de la lectura de la presin arterial. Esta informacin no tiene Marine scientist el consejo del mdico. Asegresede hacerle al mdico cualquier pregunta que tenga. Document Revised: 12/23/2019 Document Reviewed: 03/03/2019 Elsevier Patient Education  2022 [place] Insomnia El insomnio es un trastorno del sueo que causa dificultades para conciliar el sueo o para [place]. Puede producir fatiga, falta de energa, dificultad para concentrarse, cambios en el estado de nimo y mal rendimiento escolar olaboral. Hay tres formas diferentes de clasificar el insomnio: Dificultad para conciliar el sueo. Dificultad para mantener el sueo. Despertar muy precoz por la maana. Cualquier tipo de insomnio puede ser a Barrister's clerk (crnico) o a Control and instrumentation engineer (agudo). Ambos son frecuentes. Generalmente, el insomnio a corto plazo dura tres meses o menos tiempo. El crnico ocurre al menos tres veces por semana durantems de tres meses. Cules son las causas? El insomnio puede deberse a otra afeccin, situacin o sustancia, por ejemplo: Ansiedad. Ciertos medicamentos. Enfermedad de reflujo gastroesofgico (ERGE) u otras enfermedades gastrointestinales. Asma y otras enfermedades respiratorias. Sndrome de las piernas inquietas, apnea del sueo u  otros trastornos del sueo. Dolor crnico. Menopausia. Accidente cerebrovascular. Consumo excesivo de alcohol, tabaco u drogas ilegales. Afecciones de salud mental, como  depresin. Cafena. Trastornos neurolgicos, como enfermedad de Alzheimer. Hiperactividad de la glndula tiroidea (hipertiroidismo). En ocasiones, la causa del insomnio puede ser desconocida. Qu incrementa el riesgo? Los factores de riesgo de tener insomnio incluyen lo siguiente: Sexo. La enfermedad afecta ms a menudo a las mujeres que a los hombres. La edad. El insomnio es ms frecuente a medida que una persona envejece. Estrs. Falta de actividad fsica. Los horarios de trabajo irregulares o los turnos nocturnos. Los viajes a lugares de diferentes zonas horarias. Ciertas afecciones mdicas y de salud mental. Cules son los signos o sntomas? Si tiene insomnio, el sntoma principal es la dificultad para conciliar el sueo o mantenerlo. Esto puede derivar en otros sntomas, por ejemplo: Sentirse fatigado o tener poca energa. Ponerse nervioso por [employer] irse a dormir. No sentirse descansado por la maana. Tener dificultad para concentrarse. Sentirse irritable, ansioso o deprimido. Cmo se diagnostica? Esta afeccin se puede diagnosticar en funcin de lo siguiente: Los sntomas y los antecedentes mdicos. El mdico puede hacerle preguntas sobre: Hbitos de sueo. Cualquier afeccin mdica que tenga. La salud mental. Un examen fsico. Cmo se trata? El tratamiento para el insomnio depende de la causa. El tratamiento puede centrarse en tratar Ardelia Mems afeccin preexistente que causa el insomnio. El tratamiento tambin puede incluir lo siguiente: Medicamentos que lo ayuden a dormir. Asesoramiento psicolgico o terapia. Ajustes en el estilo de vida para ayudarlo a dormir mejor. Siga estas instrucciones en su casa: Comida y bebida  Limite o evite el consumo de alcohol, bebidas con cafena y cigarrillos, especialmente cerca de la hora de [place], ya que pueden perturbarle el sueo. No consuma una comida suculenta ni coma alimentos condimentados justo antes de la hora de [place].  Esto puede causarle molestias digestivas y dificultades para dormir.  Hbitos de sueo  Lleve un registro del sueo ya que podra ser de utilidad para que usted y a su mdico puedan determinar qu podra estar causndole insomnio. Escriba los siguientes datos: Cundo duerme. Cundo se despierta durante la noche. Qu tan bien duerme. Qu tan relajado se siente al da siguiente. Cualquier efecto secundario de los medicamentos que toma. Lo que usted come y bebe. Convierta su habitacin en un lugar oscuro, cmodo donde sea fcil conciliar el sueo. Coloque persianas o cortinas oscuras que impidan la entrada de la luz del exterior. Para bloquear los ruidos, use un aparato que reproduzca sonidos ambientales o relajantes de fondo. Mantenga baja la temperatura. Limite el uso de pantallas antes de la hora de [place]. Esto incluye: Watching TV. Usar el telfono inteligente, la tableta o la computadora. Siga una rutina que incluya ir a dormir y Clinical cytogeneticist a la misma [place] y noche. Esto puede ayudarlo a conciliar el sueo ms rpidamente. Considere realizar [employer] tranquila, como leer, e incorporarla como parte de la rutina a la hora de irse a dormir. Trate de evitar tomar siestas durante el da para que pueda dormir mejor por la noche. Levntese de la cama si sigue despierto despus de 15 minutos de haber intentado dormirse. [place] luces, pero intente leer o hacer una actividad tranquila. Cuando tenga sueo, regrese a Futures trader.  Instrucciones generales Use los medicamentos de venta libre y los recetados solamente como se lo haya indicado el mdico. Realice ejercicio con regularidad como se lo haya indicado el mdico. Evite la actividad fsica desde  varias horas antes de irse a dormir. Utilice tcnicas de relajacin para controlar el estrs. Pdale al mdico que le sugiera algunas tcnicas que sean adecuadas para usted. Pueden incluir: Ejercicios de respiracin. Rutinas  para aliviar la tensin muscular. Visualizacin de escenas apacibles. Conduzca con cuidado. No conduzca si est muy somnoliento. Concurra a todas las visitas de seguimiento como se lo haya indicado el mdico. Esto es importante. Comunquese con un mdico si: Est cansado durante todo Games developer. Tiene dificultad en su rutina diaria debido a la somnolencia. Sigue teniendo problemas para dormir o Press photographer. Solicite ayuda de inmediato si: Piensa seriamente en lastimarse a usted mismo o a Nurse, children's. Si alguna vez siente que puede lastimarse o Market researcher a Producer, television/film/video, o tiene pensamientos de poner fin a su vida, busque ayuda de inmediato. Puede dirigirse al servicio de emergencias ms cercano o comunicarse con: Servicio de emergencias de su localidad (911 en [place] Una lnea de asistencia al suicida y atencin en crisis como National Suicide Prevention Lifeline ([place]), al 437-754-0050. Est disponible las 24 horas del da. Resumen El insomnio es un trastorno del sueo que causa dificultades para conciliar el sueo o para [place]. El insomnio puede ser a Barrister's clerk (crnico) o a Control and instrumentation engineer (agudo). El tratamiento para el insomnio depende de la causa. El tratamiento puede centrarse en tratar Ardelia Mems afeccin preexistente que causa el insomnio. Lleve un registro del sueo ya que podra ser de utilidad para que usted y a su mdico puedan determinar qu podra estar causndole insomnio. Esta informacin no tiene Marine scientist el consejo del mdico. Asegresede hacerle al mdico cualquier pregunta que tenga. Document Revised: 12/08/2019 Document Reviewed: 12/08/2019 Elsevier Patient Education  2022 [employer].

## 2020-09-22 ENCOUNTER — Other Ambulatory Visit: Payer: Self-pay

## 2020-09-22 DIAGNOSIS — R030 Elevated blood-pressure reading, without diagnosis of hypertension: Secondary | ICD-10-CM | POA: Insufficient documentation

## 2020-09-22 DIAGNOSIS — F5104 Psychophysiologic insomnia: Secondary | ICD-10-CM | POA: Insufficient documentation

## 2020-09-22 DIAGNOSIS — F411 Generalized anxiety disorder: Secondary | ICD-10-CM | POA: Insufficient documentation

## 2020-09-22 DIAGNOSIS — E782 Mixed hyperlipidemia: Secondary | ICD-10-CM | POA: Insufficient documentation

## 2020-09-22 LAB — CBC WITH DIFFERENTIAL/PLATELET
Basophils Absolute: 0.1 10*3/uL (ref 0.0–0.2)
Basos: 1 %
EOS (ABSOLUTE): 0.1 10*3/uL (ref 0.0–0.4)
Eos: 1 %
Hematocrit: 50 % (ref 37.5–51.0)
Hemoglobin: 17 g/dL (ref 13.0–17.7)
Immature Grans (Abs): 0 10*3/uL (ref 0.0–0.1)
Immature Granulocytes: 0 %
Lymphocytes Absolute: 1.9 10*3/uL (ref 0.7–3.1)
Lymphs: 44 %
MCH: 30.6 pg (ref 26.6–33.0)
MCHC: 34 g/dL (ref 31.5–35.7)
MCV: 90 fL (ref 79–97)
Monocytes Absolute: 0.3 10*3/uL (ref 0.1–0.9)
Monocytes: 7 %
Neutrophils Absolute: 1.9 10*3/uL (ref 1.4–7.0)
Neutrophils: 47 %
Platelets: 260 10*3/uL (ref 150–450)
RBC: 5.56 x10E6/uL (ref 4.14–5.80)
RDW: 13.1 % (ref 11.6–15.4)
WBC: 4.2 10*3/uL (ref 3.4–10.8)

## 2020-09-22 LAB — COMP. METABOLIC PANEL (12)
AST: 29 IU/L (ref 0–40)
Albumin/Globulin Ratio: 1.8 (ref 1.2–2.2)
Albumin: 4.5 g/dL (ref 3.8–4.8)
Alkaline Phosphatase: 103 IU/L (ref 44–121)
BUN/Creatinine Ratio: 20 (ref 10–24)
BUN: 13 mg/dL (ref 8–27)
Bilirubin Total: 0.6 mg/dL (ref 0.0–1.2)
Calcium: 8.9 mg/dL (ref 8.6–10.2)
Chloride: 100 mmol/L (ref 96–106)
Creatinine, Ser: 0.66 mg/dL — ABNORMAL LOW (ref 0.76–1.27)
Globulin, Total: 2.5 g/dL (ref 1.5–4.5)
Glucose: 114 mg/dL — ABNORMAL HIGH (ref 65–99)
Potassium: 4.5 mmol/L (ref 3.5–5.2)
Sodium: 138 mmol/L (ref 134–144)
Total Protein: 7 g/dL (ref 6.0–8.5)
eGFR: 107 mL/min/{1.73_m2} (ref >59)

## 2020-09-22 LAB — LIPID PANEL
Chol/HDL Ratio: 6.8 ratio — ABNORMAL HIGH (ref 0.0–5.0)
Cholesterol, Total: 230 mg/dL — ABNORMAL HIGH (ref 100–199)
HDL: 34 mg/dL — ABNORMAL LOW (ref >39)
LDL Chol Calc (NIH): 145 mg/dL — ABNORMAL HIGH (ref 0–99)
Triglycerides: 279 mg/dL — ABNORMAL HIGH (ref 0–149)
VLDL Cholesterol Cal: 51 mg/dL — ABNORMAL HIGH (ref 5–40)

## 2020-09-22 LAB — PSA: Prostate Specific Ag, Serum: 0.3 ng/mL (ref 0.0–4.0)

## 2020-09-22 MED ORDER — EZETIMIBE 10 MG PO TABS
10.0000 mg | ORAL_TABLET | Freq: Every day | ORAL | 3 refills | Status: DC
  Filled 2020-09-22: qty 30, 30d supply, fill #0

## 2020-09-22 NOTE — Addendum Note (Signed)
Addended by: Kennieth Rad on: 09/22/2020 10:46 AM   Modules accepted: Orders

## 2020-09-27 ENCOUNTER — Telehealth: Payer: Self-pay | Admitting: *Deleted

## 2020-09-27 NOTE — Telephone Encounter (Signed)
-----   Message from Kennieth Rad, [place] sent at 09/22/2020 10:46 AM EDT ----- Please call patient and let him know that his kidney and liver function are within normal limits, he does show signs of dehydration.  He does not show signs of anemia.  His screening for prostate cancer was negative.  As expected his cholesterol is elevated, in addition to the atorvastatin that he resumed, he will also resume Zetia on a daily basis.  I did send that prescription to his pharmacy.    The 10-year ASCVD risk score Mikey Bussing DC Brooke Bonito., et al., 2013) is: 16.7%   Values used to calculate the score:     Age: 60 years     Sex: Male     Is Non-Hispanic African American: No     Diabetic: No     Tobacco smoker: No     Systolic Blood Pressure: Q000111Q mmHg     Is BP treated: No     HDL Cholesterol: 34 mg/dL     Total Cholesterol: 230 mg/dL

## 2020-09-27 NOTE — Telephone Encounter (Signed)
Medical Assistant used [employer] to contact patient.  Interpreter Name: Kinnie Feil #: [id number] Patient is aware of labs being normal except for elevated cholesterol which patient needs to resume zetia and atorvastatin daily.

## 2020-09-29 ENCOUNTER — Other Ambulatory Visit: Payer: Self-pay

## 2020-10-06 ENCOUNTER — Other Ambulatory Visit: Payer: Self-pay

## 2020-10-06 ENCOUNTER — Encounter (INDEPENDENT_AMBULATORY_CARE_PROVIDER_SITE_OTHER): Payer: Self-pay | Admitting: Primary Care

## 2020-10-06 ENCOUNTER — Ambulatory Visit (INDEPENDENT_AMBULATORY_CARE_PROVIDER_SITE_OTHER): Payer: Commercial Managed Care - PPO | Admitting: Primary Care

## 2020-10-06 VITALS — BP 142/79 | HR 57 | Temp 97.5°F | Ht 65.0 in | Wt 180.6 lb

## 2020-10-06 DIAGNOSIS — F411 Generalized anxiety disorder: Secondary | ICD-10-CM

## 2020-10-06 DIAGNOSIS — F5101 Primary insomnia: Secondary | ICD-10-CM

## 2020-10-06 DIAGNOSIS — I10 Essential (primary) hypertension: Secondary | ICD-10-CM | POA: Diagnosis not present

## 2020-10-06 DIAGNOSIS — F32A Depression, unspecified: Secondary | ICD-10-CM | POA: Diagnosis not present

## 2020-10-06 MED ORDER — HYDROCHLOROTHIAZIDE 25 MG PO TABS
25.0000 mg | ORAL_TABLET | Freq: Every day | ORAL | 0 refills | Status: DC
  Filled 2020-10-06: qty 30, 30d supply, fill #0
  Filled 2020-11-12: qty 30, 30d supply, fill #1

## 2020-10-06 MED ORDER — HYDROXYZINE HCL 50 MG PO TABS
ORAL_TABLET | ORAL | 1 refills | Status: DC
Start: 2020-10-06 — End: 2020-11-26
  Filled 2020-10-06: qty 60, fill #0
  Filled 2020-10-22: qty 60, 30d supply, fill #0
  Filled 2020-11-24: qty 60, 30d supply, fill #1

## 2020-10-06 NOTE — Progress Notes (Signed)
Established Patient Office Visit  Subjective:  Patient ID: Marcus Solis, male    DOB: Aug 28, 1960  Age: 60 y.o. MRN: [id number]  CC:  Chief Complaint  Patient presents with   Insomnia   Hernia    HPI [name] is a 60 year old Hispanic male ( interpretor Marcus Solis  [phone]) presents for problems with insomnia . States he has had this problem for 3 years but now it seems to be getting worst. Denies snoring, drinks coffee in morning and a coke at lunch.   Past Medical History:  Diagnosis Date   Groin pain    Left inguinal hernia 2015   Lower abdominal pain    Lumbago    Sprain of lumbar region    Unspecified disorder of male genital organs     Past Surgical History:  Procedure Laterality Date   COLONOSCOPY N/A 09/26/2017   Procedure: COLONOSCOPY;  Surgeon: Daneil Dolin, MD;  [facility]: [facility];  Service: Endoscopy;  Laterality: N/A;  12:00-rescheduled to 8/21 @ 10:30am per [place]     POLYPECTOMY  09/26/2017   Procedure: POLYPECTOMY;  Surgeon: Daneil Dolin, MD;  Location: [facility];  Service: Endoscopy;;    Family History  Problem Relation Age of Onset   Hyperlipidemia Mother    Cancer Father    Hypertension Sister     Social History   Socioeconomic History   Marital status: Married    Spouse name: Not on file   Number of children: Not on file   Years of education: Not on file   Highest education level: Not on file  Occupational History   Not on file  Tobacco Use   Smoking status: Former   Smokeless tobacco: Never   Tobacco comments:    quit 10 years ago   Vaping Use   Vaping Use: Never used  Substance and Sexual Activity   Alcohol use: No   Drug use: No   Sexual activity: Not on file  Other Topics Concern   Not on file  Social History Narrative   Not on file   Social Determinants of Health   Financial Resource Strain: Not on file  Food Insecurity: Not on file  Transportation Needs: Not on file   Physical Activity: Not on file  Stress: Not on file  Social Connections: Not on file  Intimate Partner Violence: Not on file    Outpatient Medications Prior to Visit  Medication Sig Dispense Refill   atorvastatin (LIPITOR) 40 MG tablet Take 1 tablet (40 mg total) by mouth daily. 90 tablet 1   escitalopram (LEXAPRO) 20 MG tablet Take 1 tablet (20 mg total) by mouth daily. 30 tablet 5   ezetimibe (ZETIA) 10 MG tablet Take 1 tablet (10 mg total) by mouth daily. 90 tablet 3   omeprazole (PRILOSEC) 20 MG capsule TAKE 1 CAPSULE (20 MG TOTAL) BY MOUTH DAILY. 30 capsule 3   hydrOXYzine (ATARAX/VISTARIL) 50 MG tablet Take 1/2 - 1 full tab PO QHS PRN 30 tablet 1   tiZANidine (ZANAFLEX) 2 MG tablet Take 1-2 tablets (2-4 mg total) by mouth at bedtime as needed for muscle spasms. (Patient not taking: No sig reported) 60 tablet 1   No facility-administered medications prior to visit.    No Known Allergies  ROS Review of Systems  Respiratory:  Positive for apnea and shortness of breath.        Snoring  Psychiatric/Behavioral:  Positive for sleep disturbance.  All other systems reviewed and are negative.    Objective:    Vitals:   10/06/20 1130  BP: (!) 142/79  Pulse: (!) 57  Temp: (!) 97.5 F (36.4 C)  TempSrc: Temporal  SpO2: 96%  Weight: 180 lb 9.6 oz (81.9 kg)  Height: 5' 5" (1.651 m)   Physical Exam  General: Vital signs reviewed.  Patient is well-developed and well-nourished, obese male, in no acute distress and cooperative with exam.  Head: Normocephalic and atraumatic. Eyes: EOMI, conjunctivae normal, no scleral icterus.  Neck: Supple, thick , trachea midline, normal ROM, no JVD, masses, thyromegaly, or carotid bruit present.  Cardiovascular: RRR, S1 normal, S2 normal, no murmurs, gallops, or rubs. Pulmonary/Chest: Clear to auscultation bilaterally, no wheezes, rales, or rhonchi. Abdominal: Soft, non-tender, non-distended, BS +, no masses, organomegaly, or guarding  present.  Musculoskeletal: No joint deformities, erythema, or stiffness, ROM full and nontender. Extremities: No lower extremity edema bilaterally,  pulses symmetric and intact bilaterally. No cyanosis or clubbing. Neurological: A&O x3, Strength is normal and symmetric bilaterally,  sensory intact to light touch bilaterally.  Skin: Warm, dry and intact. No rashes or erythema. Psychiatric: Normal mood and affect. speech and behavior is normal. Cognition and memory are normal.    Health Maintenance Due  Topic Date Due   COVID-19 Vaccine (1) Never done   Zoster Vaccines- Shingrix (1 of 2) Never done   INFLUENZA VACCINE  09/06/2020    There are no preventive care reminders to display for this patient.  Lab Results  Component Value Date   TSH 3.400 03/15/2017   Lab Results  Component Value Date   WBC 4.2 09/21/2020   HGB 17.0 09/21/2020   HCT 50.0 09/21/2020   MCV 90 09/21/2020   PLT 260 09/21/2020   Lab Results  Component Value Date   NA 138 09/21/2020   K 4.5 09/21/2020   CO2 22 03/14/2019   GLUCOSE 114 (H) 09/21/2020   BUN 13 09/21/2020   CREATININE 0.66 (L) 09/21/2020   BILITOT 0.6 09/21/2020   ALKPHOS 103 09/21/2020   AST 29 09/21/2020   ALT 40 03/14/2019   PROT 7.0 09/21/2020   ALBUMIN 4.5 09/21/2020   CALCIUM 8.9 09/21/2020   EGFR 107 09/21/2020   Lab Results  Component Value Date   CHOL 230 (H) 09/21/2020   Lab Results  Component Value Date   HDL 34 (L) 09/21/2020   Lab Results  Component Value Date   LDLCALC 145 (H) 09/21/2020   Lab Results  Component Value Date   TRIG 279 (H) 09/21/2020   Lab Results  Component Value Date   CHOLHDL 6.8 (H) 09/21/2020   No results found for: HGBA1C    Assessment & Plan:  Marcus Solis was seen today for insomnia and hernia.  Diagnoses and all orders for this visit:  Primary insomnia Can try melatonin 33m-15 mg at night for sleep, can also do benadryl 25-511mat night for sleep.  If this does not help we can try  prescription medication.  Also here is some information about good sleep hygiene.   Depression, unspecified depression type Flowsheet Row [facility] 18      Follow up with CSW recently started on lexapro 2057m Essential hypertension Counseled on blood pressure goal of less than 130/80, low-sodium, DASH diet, medication compliance, 150 minutes of moderate intensity exercise per week. Discussed medication compliance, adverse effects. Prescribed HCTZ 19m3m  GAD (generalized anxiety disorder) -     hydrOXYzine (ATARAX/VISTARIL) 50 MG tablet; Take 1 pill at bedtime as needed  Other orders -     hydrochlorothiazide (HYDRODIURIL) 25 MG tablet; Take 1 tablet (25 mg total) by mouth daily.     Follow-up: Return in about 6 weeks (around 11/17/2020) for bp f/u.    Kerin Perna, NP

## 2020-10-06 NOTE — Patient Instructions (Signed)
Cmo controlar su hipertensin Managing Your Hypertension La hipertensin, tambin conocida como presin arterial alta, se produce cuando la sangre ejerce presin contra las paredes de las arterias con [place] fuerza. Las arterias son los vasos sanguneos que transportan la sangre desde el corazn hacia todas las partes del cuerpo. La hipertensin hace que el corazn haga ms esfuerzo para Herbalist y puede provocar que las arterias se Teacher, music o Advertising account executive. [place] lecturas de la presin arterial La presin arterial deseada puede variar en funcin de las enfermedades, la edad y otros factores personales. Una lectura de la presin arterial consta de un nmero ms alto sobre un nmero ms bajo. En condiciones ideales, la presin arterial debe estar por debajo de 120/80. Es importante que conozca lo siguiente: El Teacher, English as a foreign language, o nmero superior, es la presin sistlica. Es la medida de la presin de las arterias cuando el corazn late. El segundo nmero, o nmero inferior, es la presin diastlica. Es la medida de la presin en las arterias cuando el corazn se relaja. La presin arterial se clasifica en cuatro etapas. Sobre la base de la lectura de su presin arterial, el mdico puede usar las siguientes etapas para determinar si necesita tratamiento y de qu tipo. La presin sistlica y la presin diastlica se miden en una unidad llamada mm Hg. Normales Presin sistlica: por debajo de 123456. Presin diastlica: por debajo de 80. Elevada Presin sistlica: [id number]. Presin diastlica: por debajo de 80. Etapa 1 de hipertensin Presin sistlica: 0000000. Presin diastlica: [id number]. Etapa 2 de hipertensin Presin sistlica: [id number] o ms. Presin diastlica: 90 o ms. Cmo puede afectarme esta enfermedad? Controlar la hipertensin es una responsabilidad importante. Con el transcurso del [place], la hipertensin puede daar las arterias y Transport planner flujo de sangre hacia partes  importantes del cuerpo que incluyen el cerebro, el corazn y los riones. Tener hipertensin no tratada o no controlada puede causar: Infarto de miocardio. Accidente cerebrovascular. Debilitamiento de los vasos sanguneos (aneurisma). Insuficiencia cardaca. Dao renal. Dao ocular. Sndrome metablico. Problemas de memoria y concentracin. Demencia vascular. Qu medidas puedo tomar para controlar esta afeccin? La hipertensin se puede controlar haciendo [employer] estilo de vida y, posiblemente, tomando medicamentos. Su mdico le ayudar a crear un plan para bajar la presin arterial al rango normal. Nutricin  Siga una dieta con alto contenido de fibras y [place], y con bajo contenido de sal (sodio), azcar agregada y grasas. Un ejemplo de plan alimenticio es la dieta sobre mtodos alimenticios para detener la hipertensin llamada DASH (Dietary Approaches to Stop Hypertension). Para alimentarse de esta manera: Coma mucha fruta y [place]. Trate de que la mitad del plato de cada comida sea de frutas y verduras. Coma cereales integrales, como pasta integral, arroz integral o pan integral. Llene aproximadamente un cuarto del plato con cereales integrales. Consuma productos lcteos descremados. Evite la ingesta de cortes de carne grasa, carne procesada o curada, y carne de ave con piel. Llene aproximadamente un cuarto del plato con protenas magras, como pescado, pollo sin piel, frijoles, huevos o tofu. Evite ingerir alimentos prehechos y procesados. En general, estos tienen mayor cantidad de sodio, azcar agregada y Wendee Copp. Reduzca su ingesta diaria de sodio. La mayora de las personas que tienen hipertensin deben comer menos de 1500 mg de sodio por [employer]. Estilo de vida  Trabaje con su mdico para mantener un peso saludable o Administrator, Civil Service. Pregntele cul es el peso recomendado para usted. Realice al menos 30 minutos de ejercicio que  haga que se acelere su corazn (ejercicio Arboriculturist)  la [employer] de la [place]. Estas actividades pueden incluir caminar, nadar o andar en bicicleta. Incluya ejercicios para fortalecer sus msculos (ejercicios de resistencia), como levantamiento de pesas, como parte de su rutina semanal de ejercicios. Intente realizar 30 minutos de este tipo de ejercicios al [employer] a la [place]. No consuma ningn producto que contenga nicotina o tabaco, como cigarrillos, cigarrillos electrnicos y tabaco de Higher education careers adviser. Si necesita ayuda para dejar de fumar, consulte al [employer]. Controle las enfermedades a largo plazo (crnicas), como el colesterol alto o la diabetes. Identifique sus causas de estrs y encuentre maneras de Scientific laboratory technician. Esto puede incluir meditacin, respiracin profunda o hacerse tiempo para Data processing manager divertidas. Consumo de alcohol No beba alcohol si: Su mdico le indica no hacerlo. Est embarazada, puede estar embarazada o est tratando de [place]. Si bebe alcohol: Limite la cantidad que bebe: De 0 a 1 medida por da para las mujeres. De 0 a 2 medidas por da para los hombres. Est atento a la cantidad de alcohol que hay en las bebidas que toma. En los [place], una medida equivale a una botella de cerveza de 12 oz (355 ml), un vaso de vino de 5 oz (148 ml) o un vaso de una bebida alcohlica de alta graduacin de 1 oz (44 ml). Medicamentos El mdico puede recetarle medicamentos si los cambios en el estilo de vida no son suficientes para Child psychotherapist la presin arterial y si: Su presin arterial sistlica es de [id number] o ms. Su presin arterial diastlica es de 80 o ms. Use los medicamentos solamente como se lo haya indicado el mdico. Siga cuidadosamente las indicaciones. Los medicamentos para la presin arterial deben tomarse como se lo haya indicado el mdico. Los medicamentos pierden eficacia al omitir las dosis. El hecho de omitir las dosis tambin [place] el riesgo de otros  problemas. Monitoreo Antes de [employer] presin arterial: No fume, no consuma bebidas con cafena ni haga ejercicio dentro de los 30 minutos antes de tomar la medicin. Vaya al bao y vace la vejiga (orine). Permanezca sentado tranquilamente durante al menos 5 minutos antes de tomar las mediciones. Contrlese la presin arterial en su casa segn las indicaciones del mdico. Para hacer esto: Sintese con la espalda recta y con apoyo. Coloque los pies planos en el piso. No se cruce de piernas. Apoye el brazo sobre una superficie plana, como una mesa. Asegrese de que la parte superior del brazo est al nivel del corazn. Cada vez que tome una medicin, tome dos o tres lecturas con un minuto de separacin y [employer]. Posiblemente tambin sea necesario que el mdico le controle la presin arterial de [place] regular. Informacin general Hable con su mdico acerca de la dieta, hbitos de ejercicio y otros factores del estilo de vida que pueden contribuir a la hipertensin. Revise con su mdico todos los medicamentos que toma ya que puede haber efectos secundarios o interacciones. Concurra a todas las visitas tal como se lo haya indicado el mdico. El mdico puede ayudarle a crear y [place] su plan para controlar la presin arterial alta. Dnde buscar ms informacin National Heart, Lung, and Blood Institute ([place], los Pulmones y Herbalist): https://wilson-eaton.com/ American Heart Association (Asociacin Estadounidense del Corazn): www.heart.org Comunquese con un mdico si: Piensa que tiene Nurse, mental health a los medicamentos que ha tomado. Tiene dolores de cabeza frecuentes (recurrentes). Siente mareos. Tiene hinchazn  en los tobillos. Tiene problemas de visin. Solicite ayuda de inmediato si: Siente un dolor de cabeza intenso o confusin. Siente debilidad inusual, adormecimiento o que Geneticist, molecular. Siente un dolor intenso en el pecho o el  abdomen. Vomita repetidas veces. Tiene dificultad para respirar. Estos sntomas pueden representar un problema grave que constituye Engineer, maintenance (IT). No espere a ver si los sntomas desaparecen. Solicite atencin mdica de inmediato. Comunquese con el servicio de emergencias de su localidad (911 en los [place]). No conduzca por sus propios medios [employer] hospital. Resumen La hipertensin se produce cuando la sangre bombea en las arterias con mucha fuerza. Si esta afeccin no se controla, podra correr riesgo de tener complicaciones graves. La presin arterial deseada puede variar en funcin de las enfermedades, la edad y otros factores personales. Para la [employer], una presin arterial normal es menor que 120/80. La hipertensin se puede controlar mediante cambios en el estilo de vida, tomando medicamentos, o ambas cosas. Los [employer] Corporation estilo de vida para ayudar a Chief Technology Officer la hipertensin incluyen prdida de [place], seguir una dieta saludable, con bajo contenido de sodio, hacer ms ejercicio, dejar de fumar y limitar el consumo de alcohol. Esta informacin no tiene Marine scientist el consejo del mdico. Asegrese de hacerle al mdico cualquier pregunta que tenga. Document Revised: 02/28/2019 Document Reviewed: 02/28/2019 Elsevier Patient Education  2022 [employer].

## 2020-10-12 ENCOUNTER — Ambulatory Visit: Payer: Commercial Managed Care - PPO | Admitting: Physician Assistant

## 2020-10-12 ENCOUNTER — Other Ambulatory Visit: Payer: Self-pay

## 2020-10-12 VITALS — BP 131/83 | HR 61 | Temp 98.2°F | Resp 18 | Ht 65.0 in | Wt 177.0 lb

## 2020-10-12 DIAGNOSIS — R030 Elevated blood-pressure reading, without diagnosis of hypertension: Secondary | ICD-10-CM | POA: Diagnosis not present

## 2020-10-12 DIAGNOSIS — F5104 Psychophysiologic insomnia: Secondary | ICD-10-CM

## 2020-10-12 DIAGNOSIS — F411 Generalized anxiety disorder: Secondary | ICD-10-CM | POA: Diagnosis not present

## 2020-10-12 DIAGNOSIS — R42 Dizziness and giddiness: Secondary | ICD-10-CM | POA: Diagnosis not present

## 2020-10-12 DIAGNOSIS — R946 Abnormal results of thyroid function studies: Secondary | ICD-10-CM

## 2020-10-12 MED ORDER — MECLIZINE HCL 25 MG PO TABS: 25.0000 mg | ORAL_TABLET | Freq: Three times a day (TID) | ORAL | 0 refills | Status: DC | PRN

## 2020-10-12 NOTE — Progress Notes (Signed)
Established Patient Office Visit  Subjective:  Patient ID: Marcus Solis, male    DOB: 1960-06-02  Age: 60 y.o. MRN: [id number]  CC:  Chief Complaint  Patient presents with   Dizziness     HPI Marcus Solis reports that he has been having dizziness for the last 2 days, states that he felt dizzy upon waking Sunday morning, had difficulty getting up out of bed.  Reports that he has not been able to be up and around much has been having difficulty eating and drinking.  Reports photosensitivity as well.    Reports that he was recently started on 25 mg of hydrochlorothiazide on August 31, states that he last took it on Saturday, has not been checking his blood pressure at home  Reports that he had also been using the increased dose of hydroxyzine, states that he has not used it in the last 2 nights due to his dizziness.  No sick contacts, no upper respiratory symptoms  Due to language barrier, an interpreter was present during the history-taking and subsequent discussion (and for part of the physical exam) with this patient.   Past Medical History:  Diagnosis Date   Groin pain    Left inguinal hernia 2015   Lower abdominal pain    Lumbago    Sprain of lumbar region    Unspecified disorder of male genital organs     Past Surgical History:  Procedure Laterality Date   COLONOSCOPY N/A 09/26/2017   Procedure: COLONOSCOPY;  Surgeon: Daneil Dolin, MD;  [facility];  Service: Endoscopy;  Laterality: N/A;  12:00-rescheduled to 8/21 @ 10:30am per [place]     POLYPECTOMY  09/26/2017   Procedure: POLYPECTOMY;  Surgeon: Daneil Dolin, MD;  Location: [facility];  Service: Endoscopy;;    Family History  Problem Relation Age of Onset   Hyperlipidemia Mother    Cancer Father    Hypertension Sister     Social History   Socioeconomic History   Marital status: Married    Spouse name: Not on file   Number of children: Not on file    Years of education: Not on file   Highest education level: Not on file  Occupational History   Not on file  Tobacco Use   Smoking status: Former   Smokeless tobacco: Never   Tobacco comments:    quit 10 years ago   Vaping Use   Vaping Use: Never used  Substance and Sexual Activity   Alcohol use: No   Drug use: No   Sexual activity: Not on file  Other Topics Concern   Not on file  Social History Narrative   Not on file   Social Determinants of Health   Financial Resource Strain: Not on file  Food Insecurity: Not on file  Transportation Needs: Not on file  Physical Activity: Not on file  Stress: Not on file  Social Connections: Not on file  Intimate Partner Violence: Not on file    Outpatient Medications Prior to Visit  Medication Sig Dispense Refill   atorvastatin (LIPITOR) 40 MG tablet Take 1 tablet (40 mg total) by mouth daily. 90 tablet 1   escitalopram (LEXAPRO) 20 MG tablet Take 1 tablet (20 mg total) by mouth daily. 30 tablet 5   ezetimibe (ZETIA) 10 MG tablet Take 1 tablet (10 mg total) by mouth daily. 90 tablet 3   hydrochlorothiazide (HYDRODIURIL) 25 MG tablet Take 1 tablet (25 mg total) by  mouth daily. 90 tablet 0   hydrOXYzine (ATARAX/VISTARIL) 50 MG tablet Take 1 pill at bedtime as needed 60 tablet 1   omeprazole (PRILOSEC) 20 MG capsule TAKE 1 CAPSULE (20 MG TOTAL) BY MOUTH DAILY. 30 capsule 3   No facility-administered medications prior to visit.    No Known Allergies  ROS Review of Systems  Constitutional:  Negative for chills and fever.  HENT: Negative.    Eyes:  Positive for photophobia.  Respiratory:  Negative for shortness of breath.   Cardiovascular:  Negative for chest pain.  Gastrointestinal:  Negative for abdominal pain, nausea and vomiting.  Endocrine: Negative.   Genitourinary: Negative.   Musculoskeletal: Negative.   Skin: Negative.   Allergic/Immunologic: Negative.   Neurological:  Positive for dizziness. Negative for seizures,  syncope, weakness and headaches.  Hematological: Negative.   Psychiatric/Behavioral: Negative.       Objective:    Physical Exam Vitals and nursing note reviewed.  Constitutional:      Appearance: Normal appearance.  HENT:     Head: Normocephalic and atraumatic.     Right Ear: External ear normal.     Left Ear: External ear normal.     Nose: Nose normal.     Mouth/Throat:     Mouth: Mucous membranes are moist.     Pharynx: Oropharynx is clear.  Eyes:     Extraocular Movements: Extraocular movements intact.     Conjunctiva/sclera: Conjunctivae normal.     Pupils: Pupils are equal, round, and reactive to light.  Cardiovascular:     Rate and Rhythm: Normal rate and regular rhythm.     Pulses: Normal pulses.     Heart sounds: Normal heart sounds.  Pulmonary:     Effort: Pulmonary effort is normal.     Breath sounds: Normal breath sounds.  Musculoskeletal:        General: Normal range of motion.     Cervical back: Normal range of motion and neck supple.  Skin:    General: Skin is warm and dry.  Neurological:     General: No focal deficit present.     Mental Status: He is alert and oriented to person, place, and time.     Cranial Nerves: No cranial nerve deficit.  Psychiatric:        Mood and Affect: Mood normal.        Behavior: Behavior normal.        Thought Content: Thought content normal.        Judgment: Judgment normal.    BP 131/83 (BP Location: Left Arm, Patient Position: Sitting, Cuff Size: Normal)   Pulse 61   Temp 98.2 F (36.8 C) (Oral)   Resp 18   Ht _0  (1.651 m)   Wt 177 lb (80.3 kg)   SpO2 98%   BMI 29.45 kg/m  Wt Readings from Last 3 Encounters:  10/12/20 177 lb (80.3 kg)  10/06/20 180 lb 9.6 oz (81.9 kg)  09/21/20 182 lb (82.6 kg)     Health Maintenance Due  Topic Date Due   COVID-19 Vaccine (1) Never done   Zoster Vaccines- Shingrix (1 of 2) Never done   INFLUENZA VACCINE  09/06/2020    There are no preventive care reminders to  display for this patient.  Lab Results  Component Value Date   TSH 5.660 (H) 10/12/2020   Lab Results  Component Value Date   WBC 4.2 09/21/2020   HGB 17.0 09/21/2020   HCT 50.0 09/21/2020  MCV 90 09/21/2020   PLT 260 09/21/2020   Lab Results  Component Value Date   NA 140 10/12/2020   K 4.6 10/12/2020   CO2 24 10/12/2020   GLUCOSE 106 (H) 10/12/2020   BUN 16 10/12/2020   CREATININE 0.80 10/12/2020   BILITOT 0.6 09/21/2020   ALKPHOS 103 09/21/2020   AST 29 09/21/2020   ALT 40 03/14/2019   PROT 7.0 09/21/2020   ALBUMIN 4.5 09/21/2020   CALCIUM 10.2 10/12/2020   EGFR 101 10/12/2020   Lab Results  Component Value Date   CHOL 230 (H) 09/21/2020   Lab Results  Component Value Date   HDL 34 (L) 09/21/2020   Lab Results  Component Value Date   LDLCALC 145 (H) 09/21/2020   Lab Results  Component Value Date   TRIG 279 (H) 09/21/2020   Lab Results  Component Value Date   CHOLHDL 6.8 (H) 09/21/2020   No results found for: HGBA1C    Assessment & Plan:   Problem List Items Addressed This Visit       Other   Psychophysiological insomnia   GAD (generalized anxiety disorder)   Elevated blood pressure reading without diagnosis of hypertension   Dizziness and giddiness - Primary   Relevant Medications   meclizine (ANTIVERT) 25 MG tablet   Other Relevant Orders   Basic Metabolic Panel (Completed)   TSH (Completed)    Meds ordered this encounter  Medications   meclizine (ANTIVERT) 25 MG tablet    Sig: Take 1 tablet (25 mg total) by mouth 3 (three) times daily as needed for dizziness.    Dispense:  30 tablet    Refill:  0    Order Specific Question:   Supervising Provider    Answer:   Joya Gaskins, PATRICK E [[id number]]  1. Dizziness and giddiness Recent CBC on August 16 was within normal limits, patient recently started on increased dose of hydroxyzine as well as hydrochlorothiazide 25 mg by primary care provider.  Patient has not taken these medications in the last  2 days and dizziness continues.  Trial meclizine, patient education given on supportive care, red flags for prompt reevaluation - Basic Metabolic Panel - TSH - meclizine (ANTIVERT) 25 MG tablet; Take 1 tablet (25 mg total) by mouth 3 (three) times daily as needed for dizziness.  Dispense: 30 tablet; Refill: 0  2. GAD (generalized anxiety disorder)   3. Elevated blood pressure reading without diagnosis of hypertension   4. Psychophysiological insomnia   I have reviewed the patient's medical history (PMH, PSH, Social History, Family History, Medications, and allergies) , and have been updated if relevant. I spent 30 minutes reviewing chart and  face to face time with patient.     Follow-up: Return if symptoms worsen or fail to improve.    Loraine Grip Mayers, PA-C

## 2020-10-12 NOTE — Patient Instructions (Signed)
You are going to take meclizine 25 mg for your dizziness.  You can use this 3 times a day as needed.  I do encourage you to increase your hydration, and get plenty of rest.  We will call you with today's lab results.  Marcus Rad, PA-C Physician Assistant [place] [email or web address]   [facility] Dizziness Los mareos son un problema muy frecuente. Se trata de una sensacin de inestabilidad o desvanecimiento. Puede sentir que se va a desmayar. Los [employer] pueden provocarle una lesin si se tropieza o se cae. Las Engineer, manufacturing de todas las edades pueden sufrir Tree surgeon, Armed forces training and education officer es ms frecuente en los adultos [place]. Esta afeccin puede tener muchas causas, entre las que se pueden [employer], la deshidratacin y [place]. Siga estas instrucciones en su casa: Comida y bebida  Beba suficiente lquido como para Theatre manager la orina de color amarillo plido. Esto evita la deshidratacin. Trate de beber ms lquidos transparentes, como agua. No beba alcohol. Limite el consumo de cafena si el mdico se lo indica. Verifique los ingredientes y la informacin nutricional para saber si un alimento o una bebida contienen cafena. Limite el consumo de sal (sodio) si el mdico se lo indica. Verifique los ingredientes y la informacin nutricional para saber si un alimento o una bebida contienen sodio. Actividad  Evite los movimientos rpidos. Levntese de las sillas con lentitud y apyese hasta sentirse bien. Por la maana, sintese primero a un lado de la cama. Cuando se sienta bien, pngase lentamente de pie mientras se sostiene de algo, hasta que sepa que ha logrado el equilibrio. Mueva las piernas con frecuencia si debe estar de pie en un lugar durante mucho tiempo. Mientras est de pie, contraiga y relaje los msculos de las piernas. No conduzca vehculos ni opere maquinaria si se siente mareado. Evite agacharse si se siente mareado.  En su casa, coloque los objetos de modo que le resulte fcil alcanzarlos sin Office manager. Estilo de vida No consuma ningn producto que contenga nicotina o tabaco. Estos productos incluyen cigarrillos, tabaco para Higher education careers adviser y aparatos de vapeo, como los Psychologist, sport and exercise. Si necesita ayuda para dejar de fumar, consulte al [employer]. Trate de reducir el nivel de estrs con mtodos como el yoga o la meditacin. Hable con el mdico si necesita ayuda para controlar el nivel de estrs. Instrucciones generales Controle sus mareos para ver si hay cambios. Use los medicamentos de venta libre y los recetados solamente como se lo haya indicado el mdico. Hable con el mdico si cree que los medicamentos que est tomando son la causa de sus mareos. Infrmele a un amigo o a un familiar si se siente mareado. Pdale a esta persona que llame al mdico si observa cambios en su comportamiento. Concurra a [place]. Esto es importante. Comunquese con un mdico si: Los mareos no desaparecen o tiene sntomas nuevos. Los [employer] o la sensacin de Engineer, petroleum. Siente nuseas. Se le redujo la audicin. Tiene fiebre. Dolor o rigidez en el cuello. Los [employer] derivan en una lesin o una cada. Solicite ayuda de inmediato si: Vomita o tiene diarrea y no puede comer ni beber nada. Tiene dificultad para hablar, caminar, tragar o [employer], las [place]. Se siente constantemente dbil. Tiene cualquier tipo de sangrado. No piensa con claridad o tiene dificultad para armar oraciones. Es posible que un amigo o un familiar adviertan que esto ocurre. Tiene dolor de pecho, dolor abdominal, sudoracin o Risk manager.  Tiene cambios en la visin o le aparece un dolor de cabeza intenso. Estos sntomas pueden representar un problema grave que constituye Engineer, maintenance (IT). No espere a ver si los sntomas desaparecen. Solicite atencin mdica de inmediato. Comunquese con el  servicio de emergencias de su localidad (911 en los [place]). No conduzca por sus propios medios [employer]. Resumen Los mareos son Ardelia Mems sensacin de inestabilidad o desvanecimiento. Esta afeccin puede tener muchas causas, entre las que se pueden [employer], la deshidratacin y [place]. Las Engineer, manufacturing de todas las edades pueden sufrir Tree surgeon, Armed forces training and education officer es ms frecuente en los adultos [place]. Beba suficiente lquido como para Theatre manager la orina de color amarillo plido. No beba alcohol. Evite los movimientos rpidos si se siente mareado. Controle sus mareos para ver si hay cambios. Esta informacin no tiene Marine scientist el consejo del mdico. Asegrese de hacerle al mdico cualquier pregunta que tenga. Document Revised: 01/19/2020 Document Reviewed: 01/19/2020 Elsevier Patient Education  2022 [employer].

## 2020-10-12 NOTE — Progress Notes (Signed)
Patient has not taken medication today. Patient last took medication yesterday. Patient has eaten today. Patient denies pain at this time Patient reports dizziness beginning on Sunday and this morning Patient reports eating [place] on Saturday and waking up  with Sunday with dizziness. Patient had the same concern and affect last visit with the [facility]. Patient states once he relieves the food he feels better.

## 2020-10-13 DIAGNOSIS — R42 Dizziness and giddiness: Secondary | ICD-10-CM | POA: Insufficient documentation

## 2020-10-13 LAB — TSH: TSH: 5.66 u[IU]/mL — ABNORMAL HIGH (ref 0.450–4.500)

## 2020-10-13 LAB — BASIC METABOLIC PANEL
BUN/Creatinine Ratio: 20 (ref 10–24)
BUN: 16 mg/dL (ref 8–27)
CO2: 24 mmol/L (ref 20–29)
Calcium: 10.2 mg/dL (ref 8.6–10.2)
Chloride: 96 mmol/L (ref 96–106)
Creatinine, Ser: 0.8 mg/dL (ref 0.76–1.27)
Glucose: 106 mg/dL — ABNORMAL HIGH (ref 65–99)
Potassium: 4.6 mmol/L (ref 3.5–5.2)
Sodium: 140 mmol/L (ref 134–144)
eGFR: 101 mL/min/{1.73_m2} (ref >59)

## 2020-10-13 NOTE — Addendum Note (Signed)
Addended by: Kennieth Rad on: 10/13/2020 11:06 AM   Modules accepted: Orders

## 2020-10-15 ENCOUNTER — Telehealth: Payer: Self-pay | Admitting: Clinical

## 2020-10-19 ENCOUNTER — Telehealth: Payer: Self-pay | Admitting: *Deleted

## 2020-10-19 NOTE — Telephone Encounter (Signed)
Medical Assistant used [employer] to contact patient.  Interpreter Name: Carlyle Lipa Interpreter #:  Patient is aware of kidney function being normal but needing to have FU testing on abnormal TSH per signed DPR. [phone] for follow up call.

## 2020-10-19 NOTE — Telephone Encounter (Signed)
-----   Message from Kennieth Rad, [place] sent at 10/13/2020 11:06 AM EDT ----- Please call patient and let him know that his kidney function is within normal limits, however his thyroid function is abnormal.  Please encourage him to return to the mobile unit for follow-up testing

## 2020-10-22 ENCOUNTER — Other Ambulatory Visit: Payer: Self-pay

## 2020-10-22 NOTE — Telephone Encounter (Signed)
Second attempt to reach pt, no answer, left vm.

## 2020-11-04 NOTE — Telephone Encounter (Signed)
Third and Final attempt to reach pt via interpreter, left vm. Will send a letter.

## 2020-11-12 ENCOUNTER — Other Ambulatory Visit: Payer: Self-pay

## 2020-11-15 ENCOUNTER — Other Ambulatory Visit: Payer: Self-pay

## 2020-11-25 ENCOUNTER — Other Ambulatory Visit: Payer: Self-pay

## 2020-11-26 ENCOUNTER — Ambulatory Visit (INDEPENDENT_AMBULATORY_CARE_PROVIDER_SITE_OTHER): Payer: Commercial Managed Care - PPO | Admitting: Primary Care

## 2020-11-26 ENCOUNTER — Other Ambulatory Visit: Payer: Self-pay

## 2020-11-26 ENCOUNTER — Encounter (INDEPENDENT_AMBULATORY_CARE_PROVIDER_SITE_OTHER): Payer: Self-pay | Admitting: Primary Care

## 2020-11-26 VITALS — BP 125/75 | HR 50 | Temp 97.3°F | Ht 65.0 in | Wt 173.6 lb

## 2020-11-26 DIAGNOSIS — I10 Essential (primary) hypertension: Secondary | ICD-10-CM

## 2020-11-26 DIAGNOSIS — R946 Abnormal results of thyroid function studies: Secondary | ICD-10-CM | POA: Diagnosis not present

## 2020-11-26 DIAGNOSIS — F32A Depression, unspecified: Secondary | ICD-10-CM

## 2020-11-26 DIAGNOSIS — F411 Generalized anxiety disorder: Secondary | ICD-10-CM | POA: Diagnosis not present

## 2020-11-26 MED ORDER — HYDROCHLOROTHIAZIDE 25 MG PO TABS
25.0000 mg | ORAL_TABLET | Freq: Every day | ORAL | 1 refills | Status: DC
  Filled 2020-11-26 – 2021-02-04 (×3): qty 90, 90d supply, fill #0
  Filled 2021-04-21: qty 30, 30d supply, fill #0
  Filled 2021-06-05: qty 30, 30d supply, fill #1

## 2020-11-26 MED ORDER — ESCITALOPRAM OXALATE 20 MG PO TABS
20.0000 mg | ORAL_TABLET | Freq: Every day | ORAL | 1 refills | Status: DC
  Filled 2020-11-26 – 2021-02-04 (×3): qty 90, 90d supply, fill #0
  Filled 2021-04-21: qty 30, 30d supply, fill #0
  Filled 2021-06-05: qty 30, 30d supply, fill #1

## 2020-11-26 NOTE — Progress Notes (Signed)
Marcus Solis is a 60 y.o. male presents for hypertension evaluation, Denies shortness of breath, headaches, chest pain or lower extremity edema, sudden onset, vision changes, unilateral weakness, dizziness, paresthesias   Patient reports adherence with medications.  Dietary habits include: monitoring sodium Exercise habits include:yes Family / Social history: unknown   Past Medical History:  Diagnosis Date   Groin pain    Left inguinal hernia 2015   Lower abdominal pain    Lumbago    Sprain of lumbar region    Unspecified disorder of male genital organs    Past Surgical History:  Procedure Laterality Date   COLONOSCOPY N/A 09/26/2017   Procedure: COLONOSCOPY;  Surgeon: Marcus Dolin, MD;  [facility];  Service: Endoscopy;  Laterality: N/A;  12:00-rescheduled to 8/21 @ 10:30am per [place]     POLYPECTOMY  09/26/2017   Procedure: POLYPECTOMY;  Surgeon: Marcus Dolin, MD;  [facility];  Service: Endoscopy;;   No Known Allergies Current Outpatient Medications on File Prior to Visit  Medication Sig Dispense Refill   atorvastatin (LIPITOR) 40 MG tablet Take 1 tablet (40 mg total) by mouth daily. 90 tablet 1   escitalopram (LEXAPRO) 20 MG tablet Take 1 tablet (20 mg total) by mouth daily. 30 tablet 5   hydrochlorothiazide (HYDRODIURIL) 25 MG tablet Take 1 tablet (25 mg total) by mouth daily. 90 tablet 0   ezetimibe (ZETIA) 10 MG tablet Take 1 tablet (10 mg total) by mouth daily. (Patient not taking: Reported on 11/26/2020) 90 tablet 3   No current facility-administered medications on file prior to visit.   Social History   Socioeconomic History   Marital status: Married    Spouse name: Not on file   Number of children: Not on file   Years of education: Not on file   Highest education level: Not on file  Occupational History   Not on file  Tobacco Use   Smoking status: Former   Smokeless  tobacco: Never   Tobacco comments:    quit 10 years ago   Vaping Use   Vaping Use: Never used  Substance and Sexual Activity   Alcohol use: No   Drug use: No   Sexual activity: Not on file  Other Topics Concern   Not on file  Social History Narrative   Not on file   Social Determinants of Health   Financial Resource Strain: Not on file  Food Insecurity: Not on file  Transportation Needs: Not on file  Physical Activity: Not on file  Stress: Not on file  Social Connections: Not on file  Intimate Partner Violence: Not on file   Family History  Problem Relation Age of Onset   Hyperlipidemia Mother    Cancer Father    Hypertension Sister      OBJECTIVE:  Vitals:   11/26/20 1005  BP: 125/75  Pulse: (!) 50  Temp: (!) 97.3 F (36.3 C)  TempSrc: Temporal  SpO2: 97%  Weight: 173 lb 9.6 oz (78.7 kg)  Height: 5\' 5"  (1.651 m)   Physical exam: General: Vital signs reviewed.  Patient is well-developed and well-nourished, overweight male in no acute distress and cooperative with exam. Head: Normocephalic and atraumatic. Eyes: EOMI, conjunctivae normal, no scleral icterus. Neck: Supple, trachea midline, normal ROM, no JVD, goiter felt, thyromegaly, or carotid bruit present. Cardiovascular: RRR, S1 normal, S2 normal, no murmurs, gallops, or rubs. Pulmonary/Chest: Clear to auscultation bilaterally, no wheezes,  rales, or rhonchi. Abdominal: Soft, non-tender, non-distended, BS +, no masses, organomegaly, or guarding present. Musculoskeletal: No joint deformities, erythema, or stiffness, ROM full and nontender. Extremities: No lower extremity edema bilaterally,  pulses symmetric and intact bilaterally. No cyanosis or clubbing. Neurological: A&O x3, Strength is normal Skin: Warm, dry and intact. No rashes or erythema. Psychiatric: Normal mood and affect. speech and behavior is normal. Cognition and memory are normal.     ROS Comprehensive ROS noted in HPI Last 3 Office BP  readings: BP Readings from Last 3 Encounters:  11/26/20 125/75  10/12/20 131/83  10/06/20 (!) 142/79    BMET    Component Value Date/Time   NA 140 10/12/2020 1726   K 4.6 10/12/2020 1726   CL 96 10/12/2020 1726   CO2 24 10/12/2020 1726   GLUCOSE 106 (H) 10/12/2020 1726   GLUCOSE 88 12/09/2013 1302   BUN 16 10/12/2020 1726   CREATININE 0.80 10/12/2020 1726   CREATININE 0.74 12/09/2013 1302   CALCIUM 10.2 10/12/2020 1726   GFRNONAA 97 03/14/2019 0953   GFRNONAA >89 12/09/2013 1302   GFRAA 112 03/14/2019 0953   GFRAA >89 12/09/2013 1302    Renal function: CrCl cannot be calculated (Patient's most recent lab result is older than the maximum 21 days allowed.).  Clinical ASCVD: Yes  The 10-year ASCVD risk score (Arnett DK, et al., 2019) is: 15.2%   Values used to calculate the score:     Age: 27 years     Sex: Male     Is Non-Hispanic African American: No     Diabetic: No     Tobacco smoker: No     Systolic Blood Pressure: 756 mmHg     Is BP treated: Yes     HDL Cholesterol: 34 mg/dL     Total Cholesterol: 230 mg/dL  ASCVD risk factors include- Marcus  Solis was seen today for blood pressure check.  Diagnoses and all orders for this visit:  Depression, unspecified depression type -     escitalopram (LEXAPRO) 20 MG tablet; Take 1 tablet (20 mg total) by mouth daily.  GAD (generalized anxiety disorder) -     escitalopram (LEXAPRO) 20 MG tablet; Take 1 tablet (20 mg total) by mouth daily.  Abnormal thyroid exam thyroid Panel With TSH  Essential hypertension  Counseled on lifestyle modifications for blood pressure control including reduced dietary sodium, increased exercise, weight reduction and adequate sleep. Also, educated patient about the risk for cardiovascular events, stroke and heart attack. Also counseled patient about the importance of medication adherence. If you participate in smoking, it is important to stop using tobacco as this will increase the risks  associated with uncontrolled blood pressure.   -Hypertension longstanding diagnosed currently HCTZ 25mg   on current medications. Patient is adherent with current medications.   Goal BP:  For patients younger than 60: Goal BP < 130/80. For patients 60 and older: Goal BP < 140/90. For patients with diabetes: Goal BP < 130/80. Your most recent BP: 125/75  Minimize salt intake. Minimize alcohol intake    This note has been created with Surveyor, quantity. Any transcriptional errors are unintentional.   Kerin Perna, NP 11/26/2020, 10:25 AM

## 2020-11-26 NOTE — Patient Instructions (Signed)
Colesterol elevado High Cholesterol El colesterol elevado es una afeccin que se caracteriza porque la sangre tiene niveles altos de una sustancia [place], cerosa y parecida a la grasa (colesterol). El hgado fabrica todo el colesterol que el organismo necesita. El organismo humano necesita pequeas cantidades de colesterol para formar las clulas. El exceso de colesterol se obtiene de los alimentos que se consumen. La sangre transporta el colesterol desde el hgado al resto del cuerpo. Si tiene el colesterol elevado, pueden acumularse depsitos (placas) en las paredes de las arterias. Las arterias son los vasos sanguneos que transportan la sangre desde el corazn al resto del cuerpo. Las placas causan que las arterias se Teacher, music y se endurezcan. Las placas de colesterol aumentan el riesgo de sufrir un infarto de miocardio y un accidente cerebrovascular. Trabaje con el mdico para [employer] concentraciones de colesterol en un rango saludable. Qu incrementa el riesgo? Los siguientes factores pueden hacer que sea ms propenso a Armed forces training and education officer afeccin: Consumir alimentos con alto contenido de grasa animal (grasa saturada) o colesterol. Tener sobrepeso. No hacer suficiente ejercicio fsico. Tener antecedentes familiares de colesterol alto (hipercolesterolemia familiar). Consumir productos con tabaco. Tener diabetes. Cules son los signos o sntomas? En la [employer], el colesterol alto no causa ningn sntoma por lo general. En los [place] graves, los niveles muy altos de colesterol pueden causar: Protuberancias de grasa debajo de la piel (xantomas). Un anillo blanco o gris alrededor del centro negro (pupila) del ojo. Cmo se diagnostica? Esta afeccin se puede diagnosticar en funcin de los resultados de un anlisis de [place]. Si es mayor de 20 aos, es posible que el mdico le controle el nivel de colesterol cada 4 a 6 aos. Los controles pueden ser ms frecuentes si tiene el  colesterol elevado u otros factores de riesgo de enfermedad cardaca. En el anlisis de sangre de [place], se determina lo siguiente: El colesterol "malo", o colesterol LDL. Este es el principal tipo de colesterol que causa enfermedades cardacas. El nivel recomendado es de menos de 100 mg/dl (2.59 mmol/l). El colesterol "bueno", o colesterol HDL. El HDL ayuda a proteger contra la enfermedad cardaca porque limpia las arterias y arrastra el LDL al hgado para que lo procese. El nivel recomendado de HDL es de 60 mg/dl (1.55 mmol/l) o ms. Triglicridos. Estos son grasas que el organismo puede Financial controller o quemar como fuente de [place]. El nivel recomendado es de menos de 150 mg/dl (1.69 mmol/l). Colesterol total. Mide la cantidad total de colesterol en la sangre, e incluye el colesterol LDL, el colesterol HDL y los triglicridos. El nivel recomendado es de menos de 200 mg/dl (5.17 mmol/l). Cmo se trata? El tratamiento para el colesterol alto comienza con cambios en el estilo de vida, tales como dieta y [place]. Cambios en la dieta. Es posible que le indiquen que consuma alimentos con ms [place] y menos grasas saturadas o Holiday representative. Cambios en el estilo de vida. Estos pueden incluir hacer actividad fsica con regularidad, mantener un peso saludable y dejar de consumir productos con tabaco. Medicamentos. Estos se administran cuando los [employer] dieta y en el estilo de vida no han sido eficaces. Es posible que le receten medicamentos llamados estatinas para bajar sus niveles de colesterol. Siga estas instrucciones en su casa: Comida y bebida  Siga una dieta saludable y [place]. Esta dieta incluye lo siguiente: Porciones diarias de frutas y verduras frescas, congeladas o enlatadas. Porciones diarias de alimentos integrales con alto contenido de [place]. Alimentos con  bajo contenido de grasas saturadas y grasas trans. Estos incluyen carne de ave y pescado sin piel, cortes de carne magros  y productos lcteos descremados. Una variedad de pescado, especialmente pescado graso que contenga cidos grasos omega 3. Propngase comer pescado al [employer] veces por semana. Evite los alimentos y las bebidas que tengan Holiday representative. Use mtodos de coccin saludables, como asar, Interior and spatial designer, hervir, hornear, escalfar, cocer al vapor y IT consultant. No fra los alimentos excepto para saltearlos. Si bebe alcohol: Limite la cantidad que bebe a lo siguiente: De 0 a 1 medida por da para las mujeres que no estn embarazadas. De 0 a 2 medidas por da para los hombres. Sepa cunta cantidad de alcohol hay en las bebidas. En los [place], una medida equivale a una botella de cerveza de 12 oz (355 ml), un vaso de vino de 5 oz (148 ml) o un vaso de una bebida alcohlica de alta graduacin de 1 oz (44 ml). Estilo de vida  Haga ejercicio con regularidad. Trate de hacer un total de 150 minutos de actividad fsica por semana. Aumente la cantidad de ejercicio fsico que realiza mediante actividades como la jardinera, Forensic scientist a Writer o usar las escaleras. No consuma ningn producto que contenga nicotina o tabaco. Estos productos incluyen cigarrillos, tabaco para Higher education careers adviser y aparatos de vapeo, como los Psychologist, sport and exercise. Si necesita ayuda para dejar de consumir estos productos, consulte al [employer]. Indicaciones generales Use los medicamentos de venta libre y los recetados solamente como se lo haya indicado el mdico. Consulting civil engineer a todas las visitas de seguimiento. Esto es importante. Dnde buscar ms informacin American Heart Association (Asociacin Estadounidense del Corazn): www.heart.org National Heart, Lung, and Blood Institute ([place], los Pulmones y Herbalist): https://wilson-eaton.com/ Comunquese con un mdico si: Tiene dificultad para alcanzar o mantener una alimentacin sana y un peso saludable. Est por comenzar un programa de ejercicios. No puede dejar de fumar. Solicite ayuda  de inmediato si: Electronics engineer. Tiene dificultad para respirar. Tiene molestias o dolor en la [place], el cuello, la espalda, los hombros o [place]. Tiene sntomas de un accidente cerebrovascular. "BE FAST" es una manera fcil de recordar los principales signos de advertencia de un accidente cerebrovascular: B: Balance (equilibrio). Los signos son mareos, dificultad repentina para caminar o prdida del equilibrio. E - Eyes (ojos). Los signos son problemas para ver o un cambio repentino en la visin. F: Face (rostro). Los signos son debilidad repentina o entumecimiento del rostro, o el rostro o el prpado que se caen hacia un lado. A: Arms (brazos). Los signos son debilidad o entumecimiento en un brazo. Esto sucede de repente y generalmente en un lado del cuerpo. S: Speech (habla). Los signos son dificultad para hablar, hablar arrastrando las palabras o dificultad para comprender lo que las Patent examiner. T: Time (tiempo). Es tiempo de llamar al servicio de Multimedia programmer. Anote la hora a la que [employer] sntomas. Presenta otros signos de un accidente cerebrovascular, como los siguientes: Dolor de cabeza sbito e intenso que no tiene causa aparente. Nuseas o vmitos. Convulsiones. Estos sntomas pueden representar un problema grave que constituye Engineer, maintenance (IT). No espere a ver si los sntomas desaparecen. Solicite atencin mdica de inmediato. Comunquese con el servicio de emergencias de su localidad (911 en los [place]). No conduzca por sus propios medios Principal Financial. Resumen Las placas de colesterol aumentan el riesgo de sufrir un infarto de miocardio y un accidente cerebrovascular. Trabaje con el mdico para  mantener las concentraciones de colesterol en un rango saludable. Siga una dieta saludable y equilibrada, haga ejercicio con regularidad y Colin Rhein un peso saludable. No consuma ningn producto que contenga nicotina o tabaco. Estos productos incluyen  cigarrillos, tabaco para Higher education careers adviser y aparatos de vapeo, como los Psychologist, sport and exercise. Obtenga ayuda de inmediato si tiene cualquier sntoma de un accidente cerebrovascular. Esta informacin no tiene Marine scientist el consejo del mdico. Asegrese de hacerle al mdico cualquier pregunta que tenga. Document Revised: 04/12/2020 Document Reviewed: 04/12/2020 Elsevier Patient Education  [place].

## 2020-11-27 LAB — THYROID PANEL WITH TSH
Free Thyroxine Index: 1.9 (ref 1.2–4.9)
T3 Uptake Ratio: 25 % (ref 24–39)
T4, Total: 7.7 ug/dL (ref 4.5–12.0)
TSH: 3.65 u[IU]/mL (ref 0.450–4.500)

## 2020-12-07 ENCOUNTER — Telehealth: Payer: Self-pay | Admitting: *Deleted

## 2020-12-07 NOTE — Telephone Encounter (Signed)
Medical Assistant used [facility] [employer] to contact patient.  Interpreter Name: [place] #: [phone] [place] states to give a call back to [place] with [facility] at [phone].

## 2020-12-07 NOTE — Telephone Encounter (Signed)
-----   Message from Kennieth Rad, [place] sent at 11/29/2020  8:33 AM EDT ----- Please call patient and let him know that his thyroid function was back within normal limits.

## 2021-01-07 ENCOUNTER — Other Ambulatory Visit: Payer: Self-pay

## 2021-01-07 ENCOUNTER — Other Ambulatory Visit (INDEPENDENT_AMBULATORY_CARE_PROVIDER_SITE_OTHER): Payer: Self-pay | Admitting: Primary Care

## 2021-01-07 ENCOUNTER — Other Ambulatory Visit (INDEPENDENT_AMBULATORY_CARE_PROVIDER_SITE_OTHER): Payer: Commercial Managed Care - PPO

## 2021-01-07 DIAGNOSIS — E782 Mixed hyperlipidemia: Secondary | ICD-10-CM

## 2021-01-07 DIAGNOSIS — Z79899 Other long term (current) drug therapy: Secondary | ICD-10-CM

## 2021-01-07 DIAGNOSIS — I10 Essential (primary) hypertension: Secondary | ICD-10-CM

## 2021-01-08 LAB — CMP14+EGFR
ALT: 38 IU/L (ref 0–44)
AST: 24 IU/L (ref 0–40)
Albumin/Globulin Ratio: 1.8 (ref 1.2–2.2)
Albumin: 4.4 g/dL (ref 3.8–4.8)
Alkaline Phosphatase: 114 IU/L (ref 44–121)
BUN/Creatinine Ratio: 17 (ref 10–24)
BUN: 12 mg/dL (ref 8–27)
Bilirubin Total: 0.3 mg/dL (ref 0.0–1.2)
CO2: 23 mmol/L (ref 20–29)
Calcium: 9.1 mg/dL (ref 8.6–10.2)
Chloride: 101 mmol/L (ref 96–106)
Creatinine, Ser: 0.7 mg/dL — ABNORMAL LOW (ref 0.76–1.27)
Globulin, Total: 2.4 g/dL (ref 1.5–4.5)
Glucose: 114 mg/dL — ABNORMAL HIGH (ref 70–99)
Potassium: 4.5 mmol/L (ref 3.5–5.2)
Sodium: 138 mmol/L (ref 134–144)
Total Protein: 6.8 g/dL (ref 6.0–8.5)
eGFR: 105 mL/min/{1.73_m2} (ref >59)

## 2021-01-08 LAB — CBC WITH DIFFERENTIAL/PLATELET
Basophils Absolute: 0 10*3/uL (ref 0.0–0.2)
Basos: 1 %
EOS (ABSOLUTE): 0.1 10*3/uL (ref 0.0–0.4)
Eos: 2 %
Hematocrit: 47.9 % (ref 37.5–51.0)
Hemoglobin: 16.4 g/dL (ref 13.0–17.7)
Immature Grans (Abs): 0 10*3/uL (ref 0.0–0.1)
Immature Granulocytes: 0 %
Lymphocytes Absolute: 2.6 10*3/uL (ref 0.7–3.1)
Lymphs: 44 %
MCH: 30.4 pg (ref 26.6–33.0)
MCHC: 34.2 g/dL (ref 31.5–35.7)
MCV: 89 fL (ref 79–97)
Monocytes Absolute: 0.4 10*3/uL (ref 0.1–0.9)
Monocytes: 8 %
Neutrophils Absolute: 2.6 10*3/uL (ref 1.4–7.0)
Neutrophils: 45 %
Platelets: 263 10*3/uL (ref 150–450)
RBC: 5.39 x10E6/uL (ref 4.14–5.80)
RDW: 13.2 % (ref 11.6–15.4)
WBC: 5.7 10*3/uL (ref 3.4–10.8)

## 2021-01-08 LAB — LIPID PANEL
Chol/HDL Ratio: 5.8 ratio — ABNORMAL HIGH (ref 0.0–5.0)
Cholesterol, Total: 204 mg/dL — ABNORMAL HIGH (ref 100–199)
HDL: 35 mg/dL — ABNORMAL LOW (ref >39)
LDL Chol Calc (NIH): 115 mg/dL — ABNORMAL HIGH (ref 0–99)
Triglycerides: 309 mg/dL — ABNORMAL HIGH (ref 0–149)
VLDL Cholesterol Cal: 54 mg/dL — ABNORMAL HIGH (ref 5–40)

## 2021-01-10 ENCOUNTER — Other Ambulatory Visit (INDEPENDENT_AMBULATORY_CARE_PROVIDER_SITE_OTHER): Payer: Self-pay | Admitting: Primary Care

## 2021-01-10 ENCOUNTER — Other Ambulatory Visit: Payer: Self-pay

## 2021-01-10 DIAGNOSIS — E782 Mixed hyperlipidemia: Secondary | ICD-10-CM

## 2021-01-10 MED ORDER — EZETIMIBE 10 MG PO TABS
10.0000 mg | ORAL_TABLET | Freq: Every day | ORAL | 3 refills | Status: DC
  Filled 2021-01-10: qty 90, 90d supply, fill #0

## 2021-01-10 MED ORDER — ATORVASTATIN CALCIUM 40 MG PO TABS
40.0000 mg | ORAL_TABLET | Freq: Every day | ORAL | 1 refills | Status: DC
  Filled 2021-01-10: qty 90, 90d supply, fill #0
  Filled 2021-02-04 – 2021-04-21 (×2): qty 30, 30d supply, fill #0
  Filled 2021-06-05: qty 30, 30d supply, fill #1

## 2021-01-11 ENCOUNTER — Other Ambulatory Visit: Payer: Self-pay

## 2021-02-04 ENCOUNTER — Other Ambulatory Visit: Payer: Self-pay

## 2021-04-21 ENCOUNTER — Other Ambulatory Visit: Payer: Self-pay

## 2021-06-06 ENCOUNTER — Other Ambulatory Visit: Payer: Self-pay

## 2022-03-28 ENCOUNTER — Ambulatory Visit (INDEPENDENT_AMBULATORY_CARE_PROVIDER_SITE_OTHER): Payer: Medicaid Other | Admitting: Primary Care

## 2022-04-10 ENCOUNTER — Other Ambulatory Visit: Payer: Self-pay

## 2022-04-10 ENCOUNTER — Encounter (INDEPENDENT_AMBULATORY_CARE_PROVIDER_SITE_OTHER): Payer: Self-pay | Admitting: Primary Care

## 2022-04-10 ENCOUNTER — Ambulatory Visit (INDEPENDENT_AMBULATORY_CARE_PROVIDER_SITE_OTHER): Payer: Self-pay | Admitting: Primary Care

## 2022-04-10 VITALS — BP 123/82 | HR 64 | Resp 16 | Ht 65.0 in | Wt 193.2 lb

## 2022-04-10 DIAGNOSIS — E782 Mixed hyperlipidemia: Secondary | ICD-10-CM

## 2022-04-10 DIAGNOSIS — R1013 Epigastric pain: Secondary | ICD-10-CM

## 2022-04-10 DIAGNOSIS — R21 Rash and other nonspecific skin eruption: Secondary | ICD-10-CM

## 2022-04-10 DIAGNOSIS — F4323 Adjustment disorder with mixed anxiety and depressed mood: Secondary | ICD-10-CM

## 2022-04-10 DIAGNOSIS — R946 Abnormal results of thyroid function studies: Secondary | ICD-10-CM

## 2022-04-10 DIAGNOSIS — I10 Essential (primary) hypertension: Secondary | ICD-10-CM

## 2022-04-10 MED ORDER — TRIAMCINOLONE ACETONIDE 0.1 % EX CREA
1.0000 | TOPICAL_CREAM | Freq: Two times a day (BID) | CUTANEOUS | 0 refills | Status: AC
  Filled 2022-04-10: qty 30, 15d supply, fill #0

## 2022-04-10 NOTE — Progress Notes (Signed)
  Marcus Solis, is a 62 y.o. male  [id number]  [id number]  DOB - 07-16-1960  Chief Complaint  Patient presents with   Hypertension   Hyperlipidemia      Marcus Solis [id number] Subjective:   [name] is a 62 y.o. male here today for a follow up visit. Patient has No headache, No chest pain, No abdominal pain - No Nausea, No new weakness tingling or numbness, No Cough - shortness of breath  No problems updated.  No Known Allergies  Past Medical History:  Diagnosis Date   Groin pain    Left inguinal hernia 2015   Lower abdominal pain    Lumbago    Sprain of lumbar region    Unspecified disorder of male genital organs     Current Outpatient Medications on File Prior to Visit  Medication Sig Dispense Refill   famotidine (PEPCID) 20 MG tablet Take by mouth.     No current facility-administered medications on file prior to visit.    Objective:   Vitals:   04/10/22 1519  BP: 123/82  Pulse: 64  Resp: 16  SpO2: 97%  Weight: 193 lb 3.2 oz (87.6 kg)  Height: '5\' 5"'$  (1.651 m)    Comprehensive ROS Pertinent positive and negative noted in HPI   Exam General appearance : Awake, alert, not in any distress. Speech Clear. Not toxic looking HEENT: Atraumatic and Normocephalic, pupils equally reactive to light and accomodation Neck: Supple, no JVD. No cervical lymphadenopathy.  Chest: Good air entry bilaterally, no added sounds  CVS: S1 S2 regular, no murmurs.  Abdomen: Bowel sounds present, Non tender and not distended with no gaurding, rigidity or rebound. Extremities: B/L Lower Ext shows no edema, both legs are warm to touch Neurology: Awake alert, and oriented X 3, CN II-XII intact, Non focal Skin: No Rash  Data Review No results found for: "HGBA1C"  Assessment & Plan   Marcus Solis was seen today for hypertension and hyperlipidemia.  Diagnoses and all orders for this visit:  Mixed hypercholesterolemia and  hypertriglyceridemia -     Comprehensive metabolic panel -     Lipid panel  Essential hypertension Normal without medication   Abnormal thyroid exam -     TSH + free T4  Adjustment disorder with mixed anxiety and depressed mood Patient would benefit from LCSW intervention     Patient have been counseled extensively about nutrition and exercise. Other issues discussed during this visit include: low cholesterol diet, weight control and daily exercise, foot care, annual eye examinations at Ophthalmology, importance of adherence with medications and regular follow-up. We also discussed long term complications of uncontrolled diabetes and hypertension.   No follow-ups on file.  The patient was given clear instructions to go to ER or return to medical center if symptoms don't improve, worsen or new problems develop. The patient verbalized understanding. The patient was told to call to get lab results if they haven't heard anything in the next week.   This note has been created with Surveyor, quantity. Any transcriptional errors are unintentional.   Marcus Perna, NP 04/10/2022, 3:28 PM

## 2022-04-12 ENCOUNTER — Encounter (INDEPENDENT_AMBULATORY_CARE_PROVIDER_SITE_OTHER): Payer: Self-pay | Admitting: Primary Care

## 2022-04-12 ENCOUNTER — Other Ambulatory Visit (INDEPENDENT_AMBULATORY_CARE_PROVIDER_SITE_OTHER): Payer: Self-pay | Admitting: Primary Care

## 2022-04-12 ENCOUNTER — Other Ambulatory Visit: Payer: Self-pay

## 2022-04-12 DIAGNOSIS — E782 Mixed hyperlipidemia: Secondary | ICD-10-CM

## 2022-04-12 LAB — SPECIMEN STATUS

## 2022-04-12 MED ORDER — ROSUVASTATIN CALCIUM 40 MG PO TABS
40.0000 mg | ORAL_TABLET | Freq: Every day | ORAL | 3 refills | Status: AC
  Filled 2022-04-12: qty 30, 30d supply, fill #0
  Filled 2022-05-15: qty 30, 30d supply, fill #1
  Filled 2022-06-30: qty 30, 30d supply, fill #2
  Filled 2022-08-17: qty 30, 30d supply, fill #3
  Filled 2022-09-19: qty 30, 30d supply, fill #4

## 2022-04-13 ENCOUNTER — Telehealth (INDEPENDENT_AMBULATORY_CARE_PROVIDER_SITE_OTHER): Payer: Self-pay | Admitting: Licensed Clinical Social Worker

## 2022-04-13 NOTE — Telephone Encounter (Signed)
LCSWA called patient today to introduce herself and to assess patients' mental health needs. Patient did not answer the phone. LCSWA was able to leave a brief message with the patient asking them to return the call. Patient was referred by PCP for  Adjustment disorder with mixed anxiety and depressed mood. Called with interpretor  [phone]

## 2022-04-14 ENCOUNTER — Other Ambulatory Visit: Payer: Self-pay

## 2022-04-14 LAB — TSH+FREE T4
Free T4: 0.96 ng/dL (ref 0.82–1.77)
TSH: 5.66 u[IU]/mL — ABNORMAL HIGH (ref 0.450–4.500)

## 2022-04-14 LAB — COMPREHENSIVE METABOLIC PANEL
ALT: 66 IU/L — ABNORMAL HIGH (ref 0–44)
AST: 35 IU/L (ref 0–40)
Albumin/Globulin Ratio: 1.7 (ref 1.2–2.2)
Albumin: 4.6 g/dL (ref 3.9–4.9)
Alkaline Phosphatase: 124 IU/L — ABNORMAL HIGH (ref 44–121)
BUN/Creatinine Ratio: 15 (ref 10–24)
BUN: 10 mg/dL (ref 8–27)
Bilirubin Total: 0.3 mg/dL (ref 0.0–1.2)
CO2: 19 mmol/L — ABNORMAL LOW (ref 20–29)
Calcium: 9.4 mg/dL (ref 8.6–10.2)
Chloride: 97 mmol/L (ref 96–106)
Creatinine, Ser: 0.67 mg/dL — ABNORMAL LOW (ref 0.76–1.27)
Globulin, Total: 2.7 g/dL (ref 1.5–4.5)
Glucose: 118 mg/dL — ABNORMAL HIGH (ref 70–99)
Potassium: 4.6 mmol/L (ref 3.5–5.2)
Sodium: 134 mmol/L (ref 134–144)
Total Protein: 7.3 g/dL (ref 6.0–8.5)
eGFR: 106 mL/min/{1.73_m2} (ref >59)

## 2022-04-14 LAB — LIPID PANEL
Chol/HDL Ratio: 9.2 ratio — ABNORMAL HIGH (ref 0.0–5.0)
Cholesterol, Total: 293 mg/dL — ABNORMAL HIGH (ref 100–199)
HDL: 32 mg/dL — ABNORMAL LOW
LDL Chol Calc (NIH): 128 mg/dL — ABNORMAL HIGH (ref 0–99)
Triglycerides: 715 mg/dL (ref 0–149)
VLDL Cholesterol Cal: 133 mg/dL — ABNORMAL HIGH (ref 5–40)

## 2022-04-14 LAB — SPECIMEN STATUS REPORT

## 2022-04-18 ENCOUNTER — Encounter (INDEPENDENT_AMBULATORY_CARE_PROVIDER_SITE_OTHER): Payer: Self-pay | Admitting: Primary Care

## 2022-04-19 ENCOUNTER — Other Ambulatory Visit: Payer: Self-pay

## 2022-04-19 MED ORDER — FENOFIBRATE 145 MG PO TABS
145.0000 mg | ORAL_TABLET | Freq: Every day | ORAL | 1 refills | Status: AC
  Filled 2022-04-19: qty 90, 90d supply, fill #0

## 2022-04-24 ENCOUNTER — Other Ambulatory Visit: Payer: Self-pay

## 2022-05-07 ENCOUNTER — Encounter (INDEPENDENT_AMBULATORY_CARE_PROVIDER_SITE_OTHER): Payer: Self-pay | Admitting: Primary Care

## 2022-05-15 ENCOUNTER — Other Ambulatory Visit: Payer: Self-pay

## 2022-05-15 MED ORDER — OMEPRAZOLE 20 MG PO CPDR
20.0000 mg | DELAYED_RELEASE_CAPSULE | Freq: Every morning | ORAL | 1 refills | Status: DC
  Filled 2022-05-15: qty 30, 30d supply, fill #0
  Filled 2022-06-30: qty 30, 30d supply, fill #1

## 2022-06-30 ENCOUNTER — Other Ambulatory Visit: Payer: Self-pay

## 2022-07-05 ENCOUNTER — Other Ambulatory Visit: Payer: Self-pay

## 2022-08-02 ENCOUNTER — Other Ambulatory Visit: Payer: Self-pay

## 2022-08-02 MED ORDER — LISINOPRIL 10 MG PO TABS
10.0000 mg | ORAL_TABLET | Freq: Every day | ORAL | 0 refills | Status: AC
  Filled 2022-08-02 – 2022-08-17 (×2): qty 30, 30d supply, fill #0

## 2022-08-08 ENCOUNTER — Other Ambulatory Visit: Payer: Self-pay

## 2022-08-17 ENCOUNTER — Other Ambulatory Visit: Payer: Self-pay

## 2022-08-18 ENCOUNTER — Other Ambulatory Visit: Payer: Self-pay

## 2022-08-21 ENCOUNTER — Other Ambulatory Visit: Payer: Self-pay

## 2022-08-21 MED ORDER — OMEPRAZOLE 20 MG PO CPDR
20.0000 mg | DELAYED_RELEASE_CAPSULE | Freq: Every morning | ORAL | 1 refills | Status: DC
  Filled 2022-08-21: qty 30, 30d supply, fill #0

## 2022-08-23 ENCOUNTER — Other Ambulatory Visit: Payer: Self-pay

## 2022-09-05 ENCOUNTER — Other Ambulatory Visit: Payer: Self-pay

## 2022-09-19 ENCOUNTER — Other Ambulatory Visit: Payer: Self-pay

## 2022-09-20 ENCOUNTER — Other Ambulatory Visit: Payer: Self-pay

## 2022-09-20 ENCOUNTER — Other Ambulatory Visit (INDEPENDENT_AMBULATORY_CARE_PROVIDER_SITE_OTHER): Payer: Self-pay | Admitting: Primary Care

## 2022-09-20 MED ORDER — OMEPRAZOLE 20 MG PO CPDR
20.0000 mg | DELAYED_RELEASE_CAPSULE | Freq: Every morning | ORAL | 1 refills | Status: DC
  Filled 2022-09-20: qty 30, 30d supply, fill #0
  Filled 2022-11-07: qty 30, 30d supply, fill #1

## 2022-09-20 NOTE — Telephone Encounter (Signed)
needs

## 2022-09-21 ENCOUNTER — Other Ambulatory Visit: Payer: Self-pay

## 2022-10-11 ENCOUNTER — Telehealth (INDEPENDENT_AMBULATORY_CARE_PROVIDER_SITE_OTHER): Payer: Self-pay

## 2022-10-11 ENCOUNTER — Ambulatory Visit (INDEPENDENT_AMBULATORY_CARE_PROVIDER_SITE_OTHER): Payer: BLUE CROSS/BLUE SHIELD | Admitting: Primary Care

## 2022-10-11 ENCOUNTER — Other Ambulatory Visit: Payer: Self-pay

## 2022-10-11 NOTE — Telephone Encounter (Signed)
Contacted pt to see if he would like to switch appt to virtual, still come in or reschedule pt didn't answer and  lvm  Looking into pt chart looks like [facility]  on 08/02/22

## 2022-10-13 ENCOUNTER — Other Ambulatory Visit: Payer: Self-pay

## 2022-11-08 ENCOUNTER — Other Ambulatory Visit: Payer: Self-pay

## 2022-11-09 ENCOUNTER — Other Ambulatory Visit: Payer: Self-pay

## 2023-08-27 ENCOUNTER — Other Ambulatory Visit: Payer: Self-pay

## 2023-08-27 MED ORDER — IVERMECTIN 1 % EX CREA
1.0000 | TOPICAL_CREAM | Freq: Every day | CUTANEOUS | 1 refills | Status: AC
  Filled 2023-08-27: qty 45, 30d supply, fill #0

## 2023-08-28 ENCOUNTER — Other Ambulatory Visit: Payer: Self-pay

## 2023-08-29 ENCOUNTER — Other Ambulatory Visit: Payer: Self-pay

## 2023-08-30 ENCOUNTER — Other Ambulatory Visit: Payer: Self-pay

## 2023-09-01 ENCOUNTER — Other Ambulatory Visit: Payer: Self-pay

## 2023-09-03 ENCOUNTER — Other Ambulatory Visit: Payer: Self-pay

## 2023-09-06 ENCOUNTER — Other Ambulatory Visit: Payer: Self-pay

## 2023-09-25 ENCOUNTER — Other Ambulatory Visit: Payer: Self-pay

## 2023-09-25 MED ORDER — METRONIDAZOLE 1 % EX GEL
Freq: Every day | CUTANEOUS | 1 refills | Status: DC
  Filled 2023-09-25: qty 60, 30d supply, fill #0

## 2023-09-27 ENCOUNTER — Other Ambulatory Visit: Payer: Self-pay

## 2023-09-27 MED ORDER — TRAZODONE HCL 150 MG PO TABS
150.0000 mg | ORAL_TABLET | Freq: Every evening | ORAL | 1 refills | Status: DC
  Filled 2023-09-27: qty 90, 90d supply, fill #0
  Filled 2023-12-25: qty 90, 90d supply, fill #1

## 2023-09-27 MED ORDER — METRONIDAZOLE 1 % EX GEL
1.0000 | Freq: Every day | CUTANEOUS | 2 refills | Status: AC
  Filled 2023-09-27 (×2): qty 60, 30d supply, fill #0
  Filled 2024-03-13: qty 60, 30d supply, fill #1

## 2023-09-27 MED ORDER — ROSUVASTATIN CALCIUM 40 MG PO TABS
40.0000 mg | ORAL_TABLET | Freq: Every day | ORAL | 3 refills | Status: DC
  Filled 2023-09-27: qty 90, 90d supply, fill #0

## 2023-09-27 MED ORDER — TRAZODONE HCL 150 MG PO TABS
150.0000 mg | ORAL_TABLET | Freq: Every day | ORAL | 1 refills | Status: DC
  Filled 2023-09-27: qty 90, 90d supply, fill #0

## 2023-09-27 MED ORDER — METFORMIN HCL 1000 MG PO TABS
ORAL_TABLET | ORAL | 0 refills | Status: DC
  Filled 2023-09-27: qty 90, 60d supply, fill #0

## 2023-09-27 MED ORDER — ROSUVASTATIN CALCIUM 40 MG PO TABS
40.0000 mg | ORAL_TABLET | Freq: Every day | ORAL | 3 refills | Status: AC
  Filled 2023-09-27: qty 90, 90d supply, fill #0
  Filled 2023-12-25: qty 90, 90d supply, fill #1

## 2023-09-27 MED ORDER — METFORMIN HCL 1000 MG PO TABS
ORAL_TABLET | ORAL | 0 refills | Status: DC
  Filled 2023-09-27: qty 150, 60d supply, fill #0

## 2023-09-27 MED ORDER — METRONIDAZOLE 1 % EX GEL
1.0000 | Freq: Every day | CUTANEOUS | 2 refills | Status: DC
  Filled 2023-09-27: qty 60, 30d supply, fill #0

## 2023-10-15 ENCOUNTER — Other Ambulatory Visit: Payer: Self-pay

## 2023-10-16 ENCOUNTER — Other Ambulatory Visit: Payer: Self-pay

## 2023-10-29 ENCOUNTER — Other Ambulatory Visit: Payer: Self-pay

## 2023-10-29 MED ORDER — HYDROCORTISONE 2.5 % EX CREA
TOPICAL_CREAM | CUTANEOUS | 2 refills | Status: AC
  Filled 2023-10-29: qty 30, 15d supply, fill #0
  Filled 2024-03-13: qty 30, 15d supply, fill #1

## 2023-10-29 MED ORDER — KETOCONAZOLE 2 % EX SHAM
MEDICATED_SHAMPOO | CUTANEOUS | 11 refills | Status: AC
  Filled 2023-10-29: qty 120, 30d supply, fill #0
  Filled 2024-03-13: qty 120, 30d supply, fill #1

## 2023-10-30 ENCOUNTER — Other Ambulatory Visit: Payer: Self-pay

## 2023-12-25 ENCOUNTER — Other Ambulatory Visit: Payer: Self-pay

## 2023-12-26 ENCOUNTER — Other Ambulatory Visit: Payer: Self-pay

## 2024-02-21 ENCOUNTER — Other Ambulatory Visit: Payer: Self-pay

## 2024-02-21 MED ORDER — METFORMIN HCL 1000 MG PO TABS
1000.0000 mg | ORAL_TABLET | Freq: Two times a day (BID) | ORAL | 1 refills | Status: AC
  Filled 2024-02-21: qty 180, 90d supply, fill #0

## 2024-02-21 MED ORDER — TRAZODONE HCL 150 MG PO TABS
150.0000 mg | ORAL_TABLET | Freq: Every day | ORAL | 1 refills | Status: AC
  Filled 2024-03-12: qty 90, 90d supply, fill #0

## 2024-02-28 ENCOUNTER — Other Ambulatory Visit: Payer: Self-pay

## 2024-03-07 ENCOUNTER — Other Ambulatory Visit: Payer: Self-pay

## 2024-03-07 MED ORDER — METHOCARBAMOL 500 MG PO TABS
500.0000 mg | ORAL_TABLET | Freq: Two times a day (BID) | ORAL | 0 refills | Status: AC
  Filled 2024-03-07: qty 15, 8d supply, fill #0

## 2024-03-12 ENCOUNTER — Other Ambulatory Visit: Payer: Self-pay

## 2024-03-13 ENCOUNTER — Other Ambulatory Visit: Payer: Self-pay
